# Patient Record
Sex: Female | Born: 1945 | Race: Black or African American | Hispanic: No | Marital: Single | Smoking: Former smoker
Health system: Southern US, Community
[De-identification: ages and names within clinical notes are randomized; demographics above are authoritative.]

## PROBLEM LIST (undated history)

## (undated) DIAGNOSIS — G510 Bell's palsy: Secondary | ICD-10-CM

## (undated) DIAGNOSIS — E785 Hyperlipidemia, unspecified: Secondary | ICD-10-CM

## (undated) DIAGNOSIS — I2510 Atherosclerotic heart disease of native coronary artery without angina pectoris: Secondary | ICD-10-CM

## (undated) DIAGNOSIS — R079 Chest pain, unspecified: Secondary | ICD-10-CM

## (undated) DIAGNOSIS — I219 Acute myocardial infarction, unspecified: Secondary | ICD-10-CM

## (undated) DIAGNOSIS — G43909 Migraine, unspecified, not intractable, without status migrainosus: Secondary | ICD-10-CM

## (undated) DIAGNOSIS — I10 Essential (primary) hypertension: Secondary | ICD-10-CM

## (undated) HISTORY — DX: Acute myocardial infarction, unspecified: I21.9

## (undated) HISTORY — DX: Atherosclerotic heart disease of native coronary artery without angina pectoris: I25.10

## (undated) HISTORY — DX: Chest pain, unspecified: R07.9

## (undated) HISTORY — DX: Migraine, unspecified, not intractable, without status migrainosus: G43.909

## (undated) HISTORY — DX: Bell's palsy: G51.0

## (undated) HISTORY — DX: Hyperlipidemia, unspecified: E78.5

## (undated) HISTORY — DX: Essential (primary) hypertension: I10

---
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
## 2008-04-07 ENCOUNTER — Emergency Department (HOSPITAL_COMMUNITY): Admission: EM | Admit: 2008-04-07 | Discharge: 2008-04-07 | Payer: Self-pay | Admitting: Family Medicine

## 2009-07-08 ENCOUNTER — Observation Stay (HOSPITAL_COMMUNITY): Admission: EM | Admit: 2009-07-08 | Discharge: 2009-07-09 | Payer: Self-pay | Admitting: Emergency Medicine

## 2009-07-08 ENCOUNTER — Ambulatory Visit: Payer: Self-pay | Admitting: Cardiovascular Disease

## 2009-07-09 ENCOUNTER — Encounter (INDEPENDENT_AMBULATORY_CARE_PROVIDER_SITE_OTHER): Payer: Self-pay | Admitting: Internal Medicine

## 2009-07-11 ENCOUNTER — Ambulatory Visit: Payer: Self-pay | Admitting: Family Medicine

## 2009-07-11 DIAGNOSIS — I2510 Atherosclerotic heart disease of native coronary artery without angina pectoris: Secondary | ICD-10-CM | POA: Insufficient documentation

## 2009-07-11 DIAGNOSIS — E785 Hyperlipidemia, unspecified: Secondary | ICD-10-CM | POA: Insufficient documentation

## 2009-07-11 DIAGNOSIS — G510 Bell's palsy: Secondary | ICD-10-CM | POA: Insufficient documentation

## 2009-07-11 DIAGNOSIS — I10 Essential (primary) hypertension: Secondary | ICD-10-CM

## 2009-07-11 HISTORY — DX: Hyperlipidemia, unspecified: E78.5

## 2009-07-11 HISTORY — DX: Essential (primary) hypertension: I10

## 2009-07-11 HISTORY — DX: Atherosclerotic heart disease of native coronary artery without angina pectoris: I25.10

## 2009-07-11 HISTORY — DX: Bell's palsy: G51.0

## 2010-06-23 ENCOUNTER — Ambulatory Visit: Payer: Self-pay | Admitting: Family Medicine

## 2010-06-23 LAB — CONVERTED CEMR LAB
Albumin: 4 g/dL (ref 3.5–5.2)
Alkaline Phosphatase: 66 units/L (ref 39–117)
Basophils Relative: 0.4 % (ref 0.0–3.0)
Bilirubin Urine: NEGATIVE
Blood in Urine, dipstick: NEGATIVE
CO2: 28 meq/L (ref 19–32)
Calcium: 9.5 mg/dL (ref 8.4–10.5)
Chloride: 106 meq/L (ref 96–112)
Cholesterol: 212 mg/dL — ABNORMAL HIGH (ref 0–200)
Eosinophils Absolute: 0.1 10*3/uL (ref 0.0–0.7)
Glucose, Urine, Semiquant: NEGATIVE
HDL: 45.8 mg/dL (ref >39.00)
Hemoglobin: 14.3 g/dL (ref 12.0–15.0)
Ketones, urine, test strip: NEGATIVE
Lymphocytes Relative: 39.7 % (ref 12.0–46.0)
MCHC: 34.5 g/dL (ref 30.0–36.0)
MCV: 92.7 fL (ref 78.0–100.0)
Neutro Abs: 3.4 10*3/uL (ref 1.4–7.7)
Potassium: 4.2 meq/L (ref 3.5–5.1)
RBC: 4.47 M/uL (ref 3.87–5.11)
Sodium: 141 meq/L (ref 135–145)
Specific Gravity, Urine: 1.02
Total CHOL/HDL Ratio: 5
Total Protein: 6.7 g/dL (ref 6.0–8.3)
Triglycerides: 119 mg/dL (ref 0.0–149.0)
VLDL: 23.8 mg/dL (ref 0.0–40.0)
pH: 7

## 2010-07-06 ENCOUNTER — Other Ambulatory Visit: Admission: RE | Admit: 2010-07-06 | Discharge: 2010-07-06 | Payer: Self-pay | Admitting: Family Medicine

## 2010-07-06 ENCOUNTER — Ambulatory Visit: Payer: Self-pay | Admitting: Family Medicine

## 2010-07-06 LAB — CONVERTED CEMR LAB: Cholesterol, target level: 200 mg/dL

## 2010-07-07 ENCOUNTER — Encounter: Payer: Self-pay | Admitting: Family Medicine

## 2010-07-09 ENCOUNTER — Encounter (INDEPENDENT_AMBULATORY_CARE_PROVIDER_SITE_OTHER): Payer: Self-pay | Admitting: *Deleted

## 2010-07-15 DIAGNOSIS — I219 Acute myocardial infarction, unspecified: Secondary | ICD-10-CM

## 2010-07-15 DIAGNOSIS — R079 Chest pain, unspecified: Secondary | ICD-10-CM | POA: Insufficient documentation

## 2010-07-15 DIAGNOSIS — G43909 Migraine, unspecified, not intractable, without status migrainosus: Secondary | ICD-10-CM

## 2010-07-15 HISTORY — DX: Chest pain, unspecified: R07.9

## 2010-07-15 HISTORY — DX: Migraine, unspecified, not intractable, without status migrainosus: G43.909

## 2010-07-15 HISTORY — DX: Acute myocardial infarction, unspecified: I21.9

## 2010-07-31 ENCOUNTER — Encounter (INDEPENDENT_AMBULATORY_CARE_PROVIDER_SITE_OTHER): Payer: Self-pay | Admitting: *Deleted

## 2010-08-10 ENCOUNTER — Encounter (INDEPENDENT_AMBULATORY_CARE_PROVIDER_SITE_OTHER): Payer: Self-pay | Admitting: *Deleted

## 2010-08-12 ENCOUNTER — Ambulatory Visit: Payer: Self-pay | Admitting: Gastroenterology

## 2010-08-24 ENCOUNTER — Ambulatory Visit: Payer: Self-pay | Admitting: Family Medicine

## 2010-08-26 ENCOUNTER — Ambulatory Visit: Payer: Self-pay | Admitting: Gastroenterology

## 2010-08-26 LAB — CONVERTED CEMR LAB
Albumin: 3.8 g/dL (ref 3.5–5.2)
Bilirubin, Direct: 0.1 mg/dL (ref 0.0–0.3)
Cholesterol: 145 mg/dL (ref 0–200)
HDL: 44.9 mg/dL (ref >39.00)
LDL Cholesterol: 85 mg/dL (ref 0–99)
Total Protein: 6.4 g/dL (ref 6.0–8.3)
Triglycerides: 74 mg/dL (ref 0.0–149.0)
VLDL: 14.8 mg/dL (ref 0.0–40.0)

## 2010-08-30 LAB — HM COLONOSCOPY

## 2010-09-02 ENCOUNTER — Ambulatory Visit: Payer: Self-pay | Admitting: Family Medicine

## 2010-10-06 NOTE — Letter (Signed)
Summary: Appointment - Missed  [facility], Main Office  [address]   [place], [place] [address]   Phone: [phone]  Fax: [phone]        July 31, 2010 MRN: [id number]      Amy Perry [address] [place], [place]  [address]     Dear Ms. Beverely Pace,  Our records indicate you missed your appointment on July 16, 2010 with Dr. Eden Emms. It is very important that we reach you to reschedule this appointment. We look forward to participating in your health care needs. Please contact us at the number listed above at your earliest convenience to reschedule this appointment.     Sincerely,   Glass blower/designer

## 2010-10-06 NOTE — Assessment & Plan Note (Signed)
Summary: CPX/PAP/CJR   Vital Signs:  Patient profile:   65 year old female Menstrual status:  postmenopausal Height:      62.25 inches Weight:      169 pounds BMI:     30.77 Temp:     98.0 degrees F oral Pulse rate:   80 / minute Pulse rhythm:   regular Resp:     12 per minute BP sitting:   140 / 98  (left arm) Cuff size:   regular  Vitals Entered By: Sid Falcon LPN (July 06, 2010 3:07 PM)  Nutrition Counseling: Patient's BMI is greater than 25 and therefore counseled on weight management options.  History of Present Illness: Patient here for complete physical examination.  Patient has history of coronary artery disease with reported angioplasty back in 1997. She is currently not on antilipid therapy.  Reported hx myalgias with Lipitor.  She has not seen a cardiologist in several years.  No recent chest pains.  Hypertension treated with Prinivil 10 mg daily and recently been out of medication. Also takes aspirin 325 mg daily.  Last Pap smear one year ago. No history of abnormality. Needs followup mammogram. No prior history of colonoscopy screening. Last tetanus unknown. Needs flu vaccine.  Hypertension History:      She denies headache, chest pain, palpitations, dyspnea with exertion, orthopnea, PND, peripheral edema, visual symptoms, neurologic problems, syncope, and side effects from treatment.  She notes no problems with any antihypertensive medication side effects.        Positive major cardiovascular risk factors include female age 62 years old or older, hyperlipidemia, and hypertension.  Negative major cardiovascular risk factors include no history of diabetes and non-tobacco-user status.        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  Further assessment for target organ damage reveals no history of stroke/TIA or peripheral vascular disease.    Lipid Management History:      Positive NCEP/ATP III risk factors include  female age 37 years old or older, hypertension, and ASHD (either angina/prior MI/prior CABG).  Negative NCEP/ATP III risk factors include non-diabetic, non-tobacco-user status, no prior stroke/TIA, no peripheral vascular disease, and no history of aortic aneurysm.    Clinical Review Panels:  Immunizations   Last Tetanus Booster:  Tdap (07/06/2010)   Last Flu Vaccine:  Fluvax 3+ (07/06/2010)  Lipid Management   Cholesterol:  212 (06/23/2010)   HDL (good cholesterol):  45.80 (06/23/2010)  Diabetes Management   Creatinine:  0.8 (06/23/2010)   Last Flu Vaccine:  Fluvax 3+ (07/06/2010)  CBC   WBC:  6.9 (06/23/2010)   RBC:  4.47 (06/23/2010)   Hgb:  14.3 (06/23/2010)   Hct:  41.4 (06/23/2010)   Platelets:  275.0 (06/23/2010)   MCV  92.7 (06/23/2010)   MCHC  34.5 (06/23/2010)   RDW  13.7 (06/23/2010)   PMN:  49.3 (06/23/2010)   Lymphs:  39.7 (06/23/2010)   Monos:  8.7 (06/23/2010)   Eosinophils:  1.9 (06/23/2010)   Basophil:  0.4 (06/23/2010)  Complete Metabolic Panel   Glucose:  99 (06/23/2010)   Sodium:  141 (06/23/2010)   Potassium:  4.2 (06/23/2010)   Chloride:  106 (06/23/2010)   CO2:  28 (06/23/2010)   BUN:  17 (06/23/2010)   Creatinine:  0.8 (06/23/2010)   Albumin:  4.0 (06/23/2010)   Total Protein:  6.7 (06/23/2010)   Calcium:  9.5 (06/23/2010)   Total Bili:  0.9 (06/23/2010)   Alk Phos:  66 (06/23/2010)  SGPT (ALT):  28 (06/23/2010)   SGOT (AST):  34 (06/23/2010)   Allergies: 1)  ! Sulfamethoxazole-Trimethoprim (Sulfamethoxazole-Trimethoprim)  Past History:  Past Medical History: Last updated: 07/11/2009 Hyperlipidemia Hypertension CAD  angioplasty 1997  Past Surgical History: Last updated: 07/11/2009 Appendectomy  1954  Family History: Last updated: 07/11/2009 Hyperlipidemia  parents Epilepsy  mother CAD  parent  Social History: Last updated: 07/11/2009 Occupation:  nanny and Airline pilot associate Former Smoker Alcohol use-no  Risk  Factors: Smoking Status: quit (07/11/2009) PMH-FH-SH reviewed for relevance  Review of Systems  The patient denies anorexia, fever, weight loss, weight gain, vision loss, decreased hearing, hoarseness, chest pain, syncope, dyspnea on exertion, peripheral edema, prolonged cough, headaches, hemoptysis, abdominal pain, melena, hematochezia, severe indigestion/heartburn, hematuria, incontinence, genital sores, muscle weakness, suspicious skin lesions, transient blindness, difficulty walking, depression, unusual weight change, abnormal bleeding, enlarged lymph nodes, and breast masses.    Physical Exam  General:  Well-developed,well-nourished,in no acute distress; alert,appropriate and cooperative throughout examination Head:  Normocephalic and atraumatic without obvious abnormalities. No apparent alopecia or balding. Eyes:  pupils equal, pupils round, and pupils reactive to light.   Ears:  External ear exam shows no significant lesions or deformities.  Otoscopic examination reveals clear canals, tympanic membranes are intact bilaterally without bulging, retraction, inflammation or discharge. Hearing is grossly normal bilaterally. Mouth:  Oral mucosa and oropharynx without lesions or exudates.  Teeth in good repair. Neck:  No deformities, masses, or tenderness noted. Breasts:  No mass, nodules, thickening, tenderness, bulging, retraction, inflamation, nipple discharge or skin changes noted.   Lungs:  Normal respiratory effort, chest expands symmetrically. Lungs are clear to auscultation, no crackles or wheezes. Heart:  Normal rate and regular rhythm. S1 and S2 normal without gallop, murmur, click, rub or other extra sounds. Abdomen:  Bowel sounds positive,abdomen soft and non-tender without masses, organomegaly or hernias noted. Genitalia:  Normal introitus for age, no external lesions, no vaginal discharge, mucosa pink and moist, no vaginal or cervical lesions, no vaginal atrophy, no friaility or  hemorrhage, normal uterus size and position, no adnexal masses or tenderness Extremities:  No clubbing, cyanosis, edema, or deformity noted with normal full range of motion of all joints.   Neurologic:  alert & oriented X3, cranial nerves II-XII intact, and strength normal in all extremities.   Skin:  no rashes and no suspicious lesions.   Cervical Nodes:  No lymphadenopathy noted Psych:  normally interactive, good eye contact, not anxious appearing, and not depressed appearing.     Impression & Recommendations:  Problem # 1:  Preventive Health Care (ICD-V70.0) labs reviewed with pt.  Flu vaccine and Tdap given.  Pap obtained.  She will set up mammogram. Needs colonoscopy screening.  Problem # 2:  HYPERTENSION (ICD-401.9) Assessment: Deteriorated refilled med. Her updated medication list for this problem includes:    Prinivil 10 Mg Tabs (Lisinopril) ..... One by mouth daily  Problem # 3:  HYPERLIPIDEMIA (ICD-272.4) Assessment: Deteriorated Lipids not to goal.  Start Crestor 5 mg daily with samples given and repeat lipids and hepatic in 2 months.  Problem # 4:  CAD (ICD-414.00) needs to establish with local cardiologist and will set up. Her updated medication list for this problem includes:    Prinivil 10 Mg Tabs (Lisinopril) ..... One by mouth daily    Aspirin 325 Mg Tabs (Aspirin) ..... One by mouth daily  Orders: Cardiology Referral (Cardiology)  Complete Medication List: 1)  Prinivil 10 Mg Tabs (Lisinopril) .... One by mouth daily 2)  Aspirin 325 Mg Tabs (Aspirin) .... One by mouth daily 3)  Fish Oil Concentrate 300 Mg Caps (Omega-3 fatty acids) 4)  Qc Womens Daily Multivitamin Tabs (Multiple vitamins-minerals)  Other Orders: Admin 1st Vaccine ([address]) Flu Vaccine 70yrs + (82956) Tdap => 58yrs IM (21308) Admin of Any Addtl Vaccine (65784) Pap Smear, Thin Prep ( Collection of) (O9629) Gastroenterology Referral (GI)  Hypertension Assessment/Plan:      The patient's  hypertensive risk group is category C: Target organ damage and/or diabetes.  Today's blood pressure is 140/98.    Lipid Assessment/Plan:      Based on NCEP/ATP III, the patient's risk factor category is "history of coronary disease, peripheral vascular disease, cerebrovascular disease, or aortic aneurysm".  The patient's lipid goals are as follows: Total cholesterol goal is 200; LDL cholesterol goal is 100; HDL cholesterol goal is 40; Triglyceride goal is 150.      Patient Instructions: 1)  Start Crestor 10 mg one half tablet daily. 2)  Please schedule a follow-up appointment in 2 months.  3)  Hepatic Panel prior to visit ICD-9: 272.4 4)  Lipid panel prior to visit ICD-9 : 272.4 5)  Check your  Blood Pressure regularly . If it is above: 140/90  you should make an appointment. Prescriptions: PRINIVIL 10 MG TABS (LISINOPRIL) one by mouth daily  #90 x 3   Entered and Authorized by:   Evelena Peat MD   Signed by:   Evelena Peat MD on 07/06/2010   Method used:   Electronically to        [facility]  [phone]* (retail)       [address]       [place], [place]  [address]       Ph: [phone] or [phone]       Fax: [phone]   RxID:   [id number]    Orders Added: 1)  Admin 1st Vaccine [90471] 2)  Flu Vaccine 87yrs + [01093] 3)  Tdap => 92yrs IM [23557] 4)  Admin of Any Addtl Vaccine [90472] 5)  Est. Patient 40-64 years [99396] 6)  Pap Smear, Thin Prep ( Collection of) [Q0091] 7)  Gastroenterology Referral [GI] 8)  Cardiology Referral [Cardiology] 9)  Est. Patient Level IV [32202]   Immunizations Administered:  Tetanus Vaccine:    Vaccine Type: Tdap    Site: right deltoid    Mfr: GlaxoSmithKline    Dose: 0.5 ml    Route: IM    Given by: Sid Falcon LPN    Exp. Date: 06/25/2012    Lot #: [id number]    VIS given: 07/24/08 version given July 06, 2010.   Immunizations Administered:  Tetanus Vaccine:    Vaccine Type:  Tdap    Site: right deltoid    Mfr: GlaxoSmithKline    Dose: 0.5 ml    Route: IM    Given by: Sid Falcon LPN    Exp. Date: 06/25/2012    Lot #: [id number]    VIS given: 07/24/08 version given July 06, 2010. Flu Vaccine Consent Questions     Do you have a history of severe allergic reactions to this vaccine? no    Any prior history of allergic reactions to egg and/or gelatin? no    Do you have a sensitivity to the preservative Thimersol? no    Do you have a past history of Guillan-Barre Syndrome? no    Do you currently have an acute febrile illness? no  Have you ever had a severe reaction to latex? no    Vaccine information given and explained to patient? yes    Are you currently pregnant? no    Lot Number:[id number]   Exp Date:03/06/2011   Site Given  Left Deltoid IM .lbflu1

## 2010-10-06 NOTE — Letter (Signed)
Summary: Pre Visit Letter Revised  [facility]  [address] [place], [place] [address]   Phone: [phone]  Fax: [phone]        07/09/2010 MRN: [id number] Amy Perry [address] [place], [place]  [address]             Procedure Date:  08-26-10   Welcome to the Gastroenterology Division at [facility].    You are scheduled to see a nurse for your pre-procedure visit on 08-12-10 at 4:30p.m. on the 3rd floor at [facility], [address]  We ask that you try to arrive at our office 15 minutes prior to your appointment time to allow for check-in.  Please take a minute to review the attached form.  If you answer "Yes" to one or more of the questions on the first page, we ask that you call the person listed at your earliest opportunity.  If you answer "No" to all of the questions, please complete the rest of the form and bring it to your appointment.    Your nurse visit will consist of discussing your medical and surgical history, your immediate family medical history, and your medications.   If you are unable to list all of your medications on the form, please bring the medication bottles to your appointment and we will list them.  We will need to be aware of both prescribed and over the counter drugs.  We will need to know exact dosage information as well.    Please be prepared to read and sign documents such as consent forms, a financial agreement, and acknowledgement forms.  If necessary, and with your consent, a friend or relative is welcome to sit-in on the nurse visit with you.  Please bring your insurance card so that we may make a copy of it.  If your insurance requires a referral to see a specialist, please bring your referral form from your primary care physician.  No co-pay is required for this nurse visit.     If you cannot keep your appointment, please call [phone] to cancel or reschedule prior to your appointment date.  This allows [place]  the opportunity to schedule an appointment for another patient in need of care.    Thank you for choosing [facility] for your medical needs.  We appreciate the opportunity to care for you.  Please visit [place] at our website  to learn more about our practice.  Sincerely, The Gastroenterology Division

## 2010-10-06 NOTE — Miscellaneous (Signed)
Summary: [facility]  Clinical Lists Changes  Medications: Added new medication of MOVIPREP 100 GM  SOLR (PEG-KCL-NACL-NASULF-NA ASC-C) As per prep instructions. - Signed Rx of MOVIPREP 100 GM  SOLR (PEG-KCL-NACL-NASULF-NA ASC-C) As per prep instructions.;  #1 x 0;  Signed;  Entered by: Ezra Sites RN;  Authorized by: Mardella Layman MD [facility];  Method used: Electronically to [facility]  [phone]*, [address], [place], [place], [place]  [address], Ph: [phone] or [phone], Fax: [phone] Allergies: Changed allergy or adverse reaction from SULFAMETHOXAZOLE-TRIMETHOPRIM (SULFAMETHOXAZOLE-TRIMETHOPRIM) to SULFAMETHOXAZOLE-TRIMETHOPRIM (SULFAMETHOXAZOLE-TRIMETHOPRIM) Added new allergy or adverse reaction of PCN    Prescriptions: MOVIPREP 100 GM  SOLR (PEG-KCL-NACL-NASULF-NA ASC-C) As per prep instructions.  #1 x 0   Entered by:   Ezra Sites RN   Authorized by:   Mardella Layman MD [facility]   Signed by:   Ezra Sites RN on 08/12/2010   Method used:   Electronically to        [facility]  [phone]* (retail)       [address]       [place], [place]  [address]       Ph: [phone] or [phone]       Fax: [phone]   RxID:   [id number]

## 2010-10-06 NOTE — Miscellaneous (Signed)
Summary: Orders Update   Clinical Lists Changes  Orders: Added new Referral order of Gastroenterology Referral (GI) - Signed 

## 2010-10-06 NOTE — Letter (Signed)
Summary: [facility] Instructions  [facility]  [address] [place], [place] [address]   Phone: [phone]  Fax: [phone]       Amy Perry    10/10/1945    MRN: [id number]        Procedure Day Dorna Bloom: Wednesday 08-26-10     Arrival Time: 8:30 a.m.     Procedure Time: 9:30 a.m.     Location of Procedure:                    _x _   [facility] (4th Floor)   PREPARATION FOR COLONOSCOPY WITH MOVIPREP   Starting 5 days prior to your procedure  08-21-10 do not eat nuts, seeds, popcorn, corn, beans, peas,  salads, or any raw vegetables.  Do not take any fiber supplements (e.g. Metamucil, Citrucel, and Benefiber).  THE DAY BEFORE YOUR PROCEDURE         DATE:  08-25-10  DAY: Tuesday   1.  Drink clear liquids the entire day-NO SOLID FOOD  2.  Do not drink anything colored red or purple.  Avoid juices with pulp.  No orange juice.  3.  Drink at least 64 oz. (8 glasses) of fluid/clear liquids during the day to prevent dehydration and help the prep work efficiently.  CLEAR LIQUIDS INCLUDE: Water Jello Ice Popsicles Tea (sugar ok, no milk/cream) Powdered fruit flavored drinks Coffee (sugar ok, no milk/cream) Gatorade Juice: apple, white grape, white cranberry  Lemonade Clear bullion, consomm, broth Carbonated beverages (any kind) Strained chicken noodle soup Hard Candy                             4.  In the morning, mix first dose of MoviPrep solution:    Empty 1 Pouch A and 1 Pouch B into the disposable container    Add lukewarm drinking water to the top line of the container. Mix to dissolve    Refrigerate (mixed solution should be used within 24 hrs)  5.  Begin drinking the prep at 5:00 p.m. The MoviPrep container is divided by 4 marks.   Every 15 minutes drink the solution down to the next mark (approximately 8 oz) until the full liter is complete.   6.  Follow completed prep with 16 oz of clear liquid of your choice (Nothing red or  purple).  Continue to drink clear liquids until bedtime.  7.  Before going to bed, mix second dose of MoviPrep solution:    Empty 1 Pouch A and 1 Pouch B into the disposable container    Add lukewarm drinking water to the top line of the container. Mix to dissolve    Refrigerate  THE DAY OF YOUR PROCEDURE      DATE:  08-26-10   DAY:  Wednesday  Beginning at  4:30 a.m. (5 hours before procedure):         1. Every 15 minutes, drink the solution down to the next mark (approx 8 oz) until the full liter is complete.  2. Follow completed prep with 16 oz. of clear liquid of your choice.    3. You may drink clear liquids until  7:30 a.m. (2 HOURS BEFORE PROCEDURE).   MEDICATION INSTRUCTIONS  Unless otherwise instructed, you should take regular prescription medications with a small sip of water   as early as possible the morning of your procedure.  OTHER INSTRUCTIONS  You will need a responsible adult at least 64 years of age to accompany you and drive you home.   This person must remain in the waiting room during your procedure.  Wear loose fitting clothing that is easily removed.  Leave jewelry and other valuables at home.  However, you may wish to bring a book to read or  an iPod/MP3 player to listen to music as you wait for your procedure to start.  Remove all body piercing jewelry and leave at home.  Total time from sign-in until discharge is approximately 2-3 hours.  You should go home directly after your procedure and rest.  You can resume normal activities the  day after your procedure.  The day of your procedure you should not:   Drive   Make legal decisions   Operate machinery   Drink alcohol   Return to work  You will receive specific instructions about eating, activities and medications before you leave.    The above instructions have been reviewed and explained to me by   Ezra Sites RN  August 12, 2010 5:02 PM    I fully understand  and can verbalize these instructions _____________________________ Date _________

## 2010-10-08 NOTE — Assessment & Plan Note (Signed)
Summary: 2 month fup//ccm rsc bmp/njr   Vital Signs:  Patient profile:   65 year old female Menstrual status:  postmenopausal Weight:      166 pounds Temp:     97.9 degrees F oral BP sitting:   110 / 64  (left arm) Cuff size:   regular  Vitals Entered By: Sid Falcon LPN (September 02, 2010 8:44 AM)  History of Present Illness: Here for follow up hyperlipidemia and hypertension. BP well controlled.  Pt had not been on antilipid therapy and we added  Crestor last visit.  Toleratin fairly well.  ?of myalgias previously on Lipitor. Lipids greatly improved on Crestor 5 mg daily.  She has not yet established with cardiologist but we have referred. No recent chest pains or dizziness.  Hypertension History:      She denies headache, chest pain, palpitations, dyspnea with exertion, orthopnea, peripheral edema, visual symptoms, neurologic problems, syncope, and side effects from treatment.        Positive major cardiovascular risk factors include female age 64 years old or older, hyperlipidemia, and hypertension.  Negative major cardiovascular risk factors include no history of diabetes and non-tobacco-user status.        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  Further assessment for target organ damage reveals no history of stroke/TIA or peripheral vascular disease.     Allergies: 1)  ! Sulfamethoxazole-Trimethoprim (Sulfamethoxazole-Trimethoprim) 2)  ! Pcn  Past History:  Past Medical History: Last updated: 07/15/2010 CAD  angioplasty 1997 HYPERTENSION  HYPERLIPIDEMIA MI  MIGRAINE HEADACHE  CHEST PAIN ROUTINE GENERAL MEDICAL EXAM@HEALTH  CARE FACL  BELLS PALSY  PMH reviewed for relevance  Physical Exam  General:  Well-developed,well-nourished,in no acute distress; alert,appropriate and cooperative throughout examination Mouth:  Oral mucosa and oropharynx without lesions or exudates.  Teeth in good repair. Neck:  No  deformities, masses, or tenderness noted. Lungs:  Normal respiratory effort, chest expands symmetrically. Lungs are clear to auscultation, no crackles or wheezes. Heart:  normal rate and regular rhythm.   Extremities:  No clubbing, cyanosis, edema, or deformity noted with normal full range of motion of all joints.     Impression & Recommendations:  Problem # 1:  HYPERTENSION (ICD-401.9)  Her updated medication list for this problem includes:    Prinivil 10 Mg Tabs (Lisinopril) ..... One by mouth daily  Problem # 2:  HYPERLIPIDEMIA (ICD-272.4)  Her updated medication list for this problem includes:    Crestor 10 Mg Tabs (Rosuvastatin calcium) ..... One half daily  Complete Medication List: 1)  Prinivil 10 Mg Tabs (Lisinopril) .... One by mouth daily 2)  Aspirin 325 Mg Tabs (Aspirin) .... One by mouth daily 3)  Fish Oil Concentrate 300 Mg Caps (Omega-3 fatty acids) 4)  Qc Womens Daily Multivitamin Tabs (Multiple vitamins-minerals) 5)  Crestor 10 Mg Tabs (Rosuvastatin calcium) .... One half daily  Hypertension Assessment/Plan:      The patient's hypertensive risk group is category C: Target organ damage and/or diabetes.  Today's blood pressure is 110/64.    Patient Instructions: 1)  Please schedule a follow-up appointment in 6 months .    Orders Added: 1)  Est. Patient Level III [[address]]

## 2010-10-08 NOTE — Procedures (Signed)
Summary: Colonoscopy  Patient: [name] Note: All result statuses are Final unless otherwise noted.  Tests: (1) Colonoscopy (COL)   COL Colonoscopy           DONE     [facility]     [address]     [place], [place]  [address]           COLONOSCOPY PROCEDURE REPORT           PATIENT:  Amy Perry, Amy Perry  MR#:  [id number]     BIRTHDATE:  [birth year], 64 yrs. old  GENDER:  female     ENDOSCOPIST:  Vania Rea. Jarold Motto, MD, [facility]     REF. BY:  Evelena Peat, M.D.     PROCEDURE DATE:  08/26/2010     PROCEDURE:  Average-risk screening colonoscopy     G0121     ASA CLASS:  Class II     INDICATIONS:  Routine Risk Screening     MEDICATIONS:   Fentanyl 75 mcg IV, Versed 8 mg IV           DESCRIPTION OF PROCEDURE:   After the risks benefits and     alternatives of the procedure were thoroughly explained, informed     consent was obtained.  Digital rectal exam was performed and     revealed no abnormalities.   The [id number] endoscope was     introduced through the anus and advanced to the cecum, which was     identified by both the appendix and ileocecal valve, limited by a     redundant colon, extreme patient discomfort.    The quality of the     prep was excellent, using MoviPrep.  The instrument was then     slowly withdrawn as the colon was fully examined.     <<PROCEDUREIMAGES>>           FINDINGS:  No polyps or cancers were seen.  This was otherwise a     normal examination of the colon.   Retroflexed views in the rectum     revealed no abnormalities.    The scope was then withdrawn from     the patient and the procedure completed.           COMPLICATIONS:  None     ENDOSCOPIC IMPRESSION:     1) No polyps or cancers     2) Otherwise normal examination     RECOMMENDATIONS:     1) Repeat Colonscopy in 10 years.     REPEAT EXAM:  No           ______________________________     Vania Rea. Jarold Motto, MD, [facility]           CC:           n.     eSIGNED:   Vania Rea.  Patterson at 08/26/2010 10:25 AM           Chauncey Cruel, [id number]  Note: An exclamation mark (!) indicates a result that was not dispersed into the flowsheet. Document Creation Date: 08/26/2010 10:25 AM _______________________________________________________________________  (1) Order result status: Final Collection or observation date-time: 08/26/2010 10:17 Requested date-time:  Receipt date-time:  Reported date-time:  Referring Physician:   Ordering Physician: Sheryn Bison [phone]) Specimen Source:  Source: Launa Grill Order Number: [id number] Lab site:   Appended Document: Colonoscopy    Clinical Lists Changes  Observations: Added new observation of COLONNXTDUE: 08/2020 (08/26/2010 10:42)

## 2010-11-15 IMAGING — CT CT HEAD W/O CM
1 series · 16 of 30 positions shown, 20 images · non-contrast
Comparison: None

CLINICAL DATA: Facial asymmetry. Right arm pain.  Evaluate for
stroke.

CT HEAD WITHOUT CONTRAST
TECHNIQUE: Contiguous axial images were obtained from the base of
the skull through the vertex without contrast.

[Series 2: brain · axial · 0.47mm/px · z∈[+142,+278]mm · 16 of 30 slices shown, 20 images]
[im 2/30  brain]
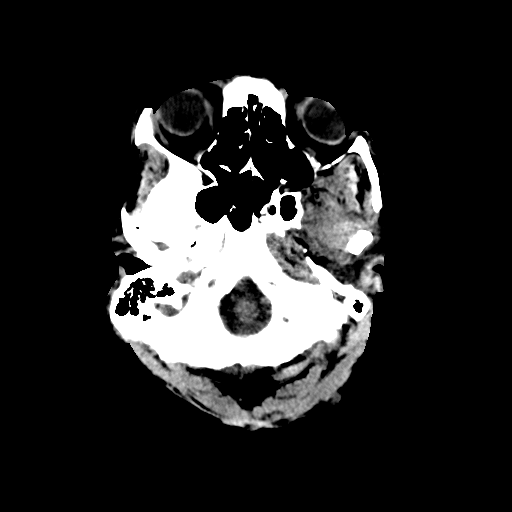
[im 2/30  bone]
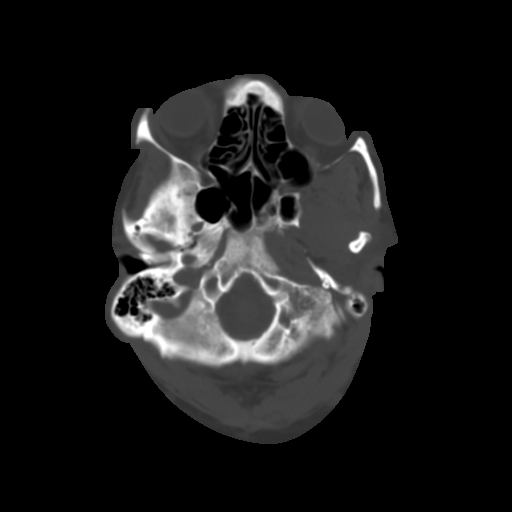
[im 4/30  brain]
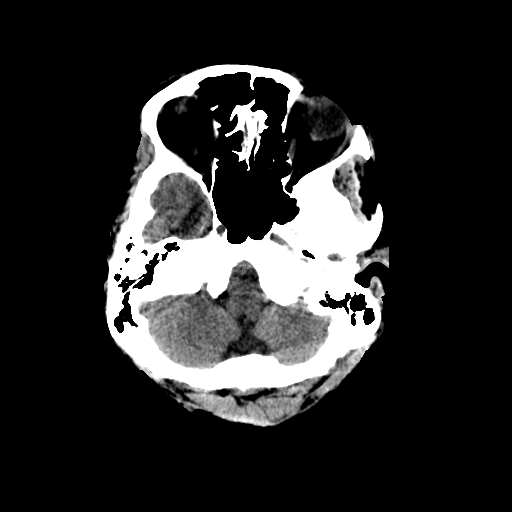
[im 6/30  brain]
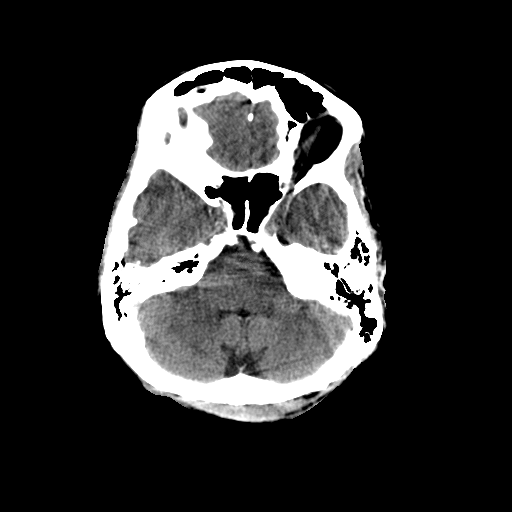
[im 8/30  brain]
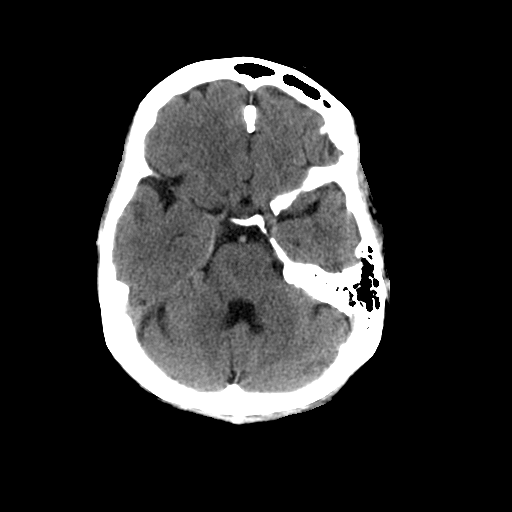
[im 9/30  brain]
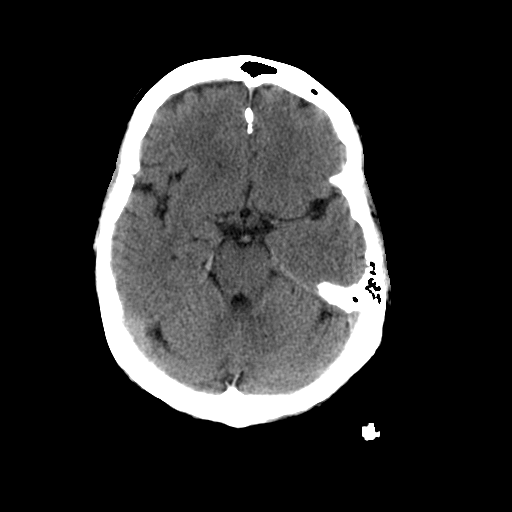
[im 9/30  bone]
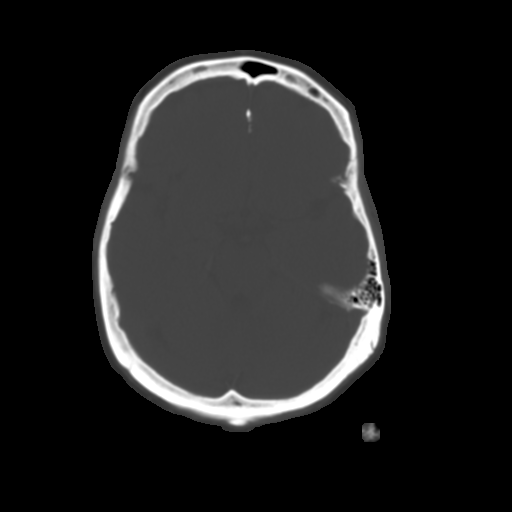
[im 11/30  brain]
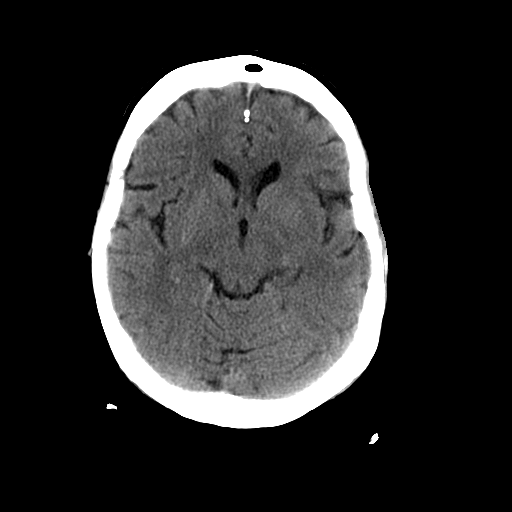
[im 13/30  brain]
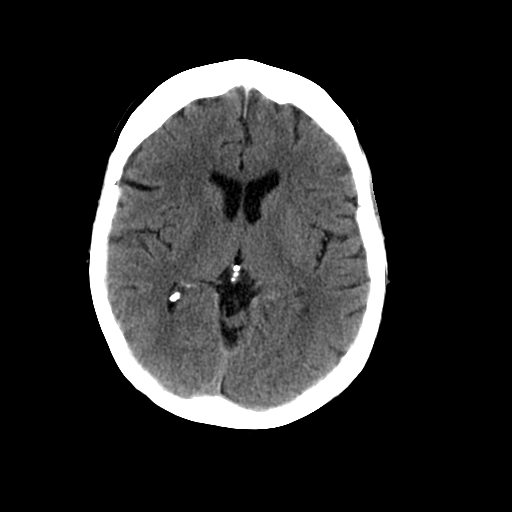
[im 15/30  brain]
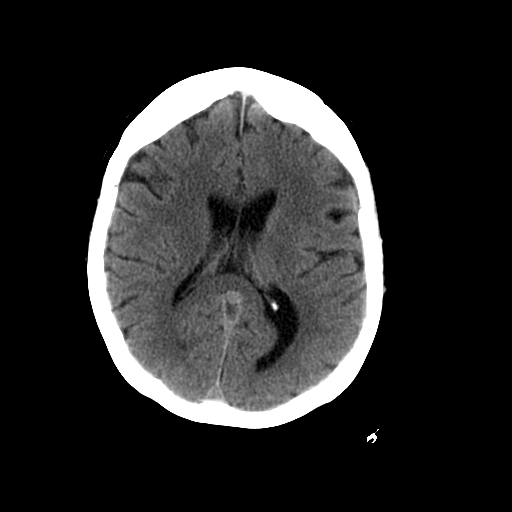
[im 16/30  brain]
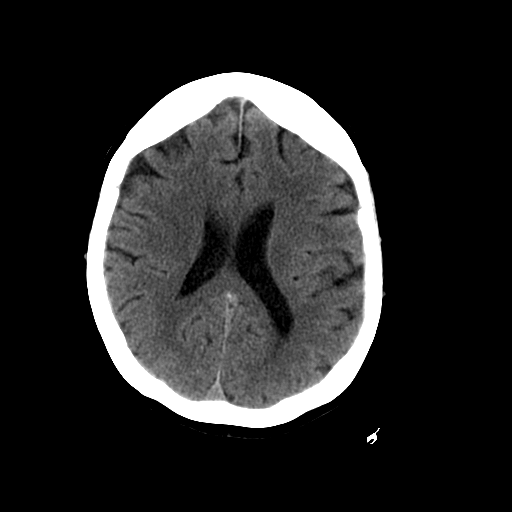
[im 16/30  bone]
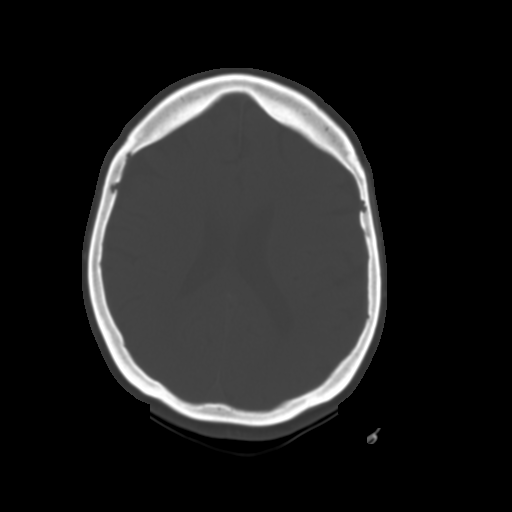
[im 18/30  brain]
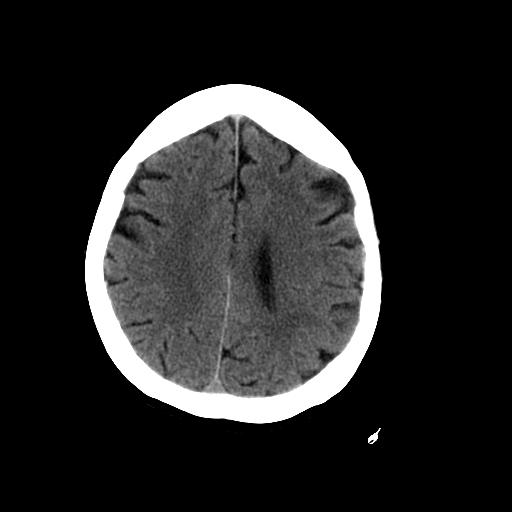
[im 20/30  brain]
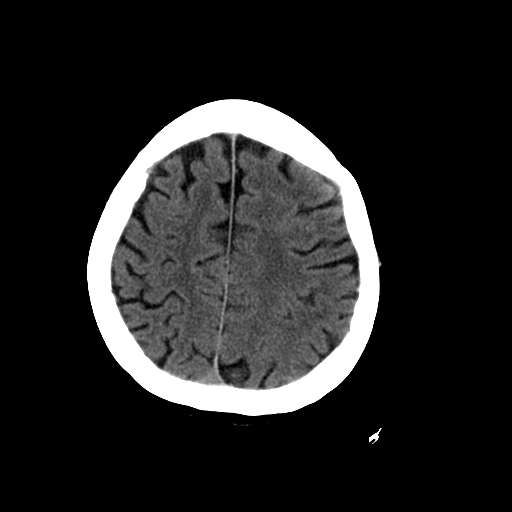
[im 22/30  brain]
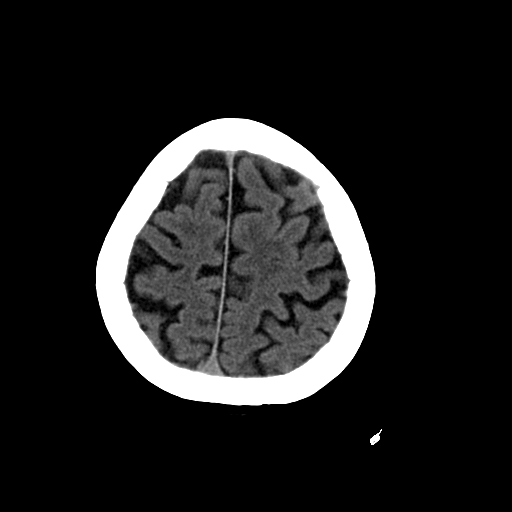
[im 23/30  brain]
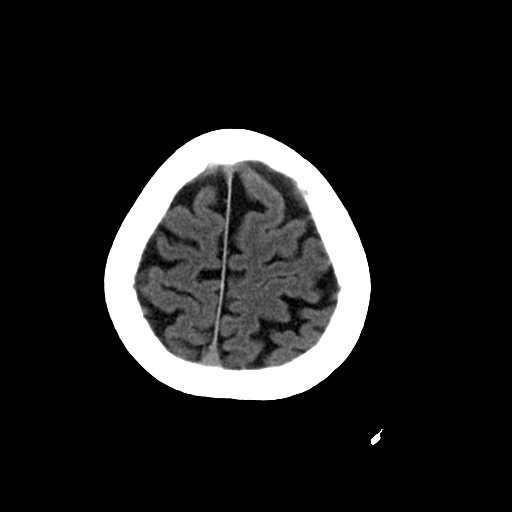
[im 23/30  bone]
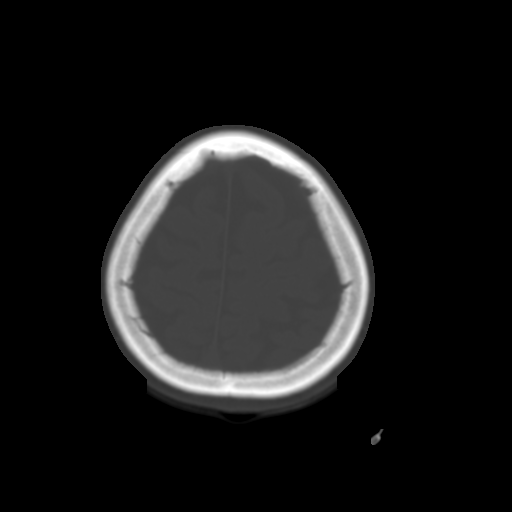
[im 25/30  brain]
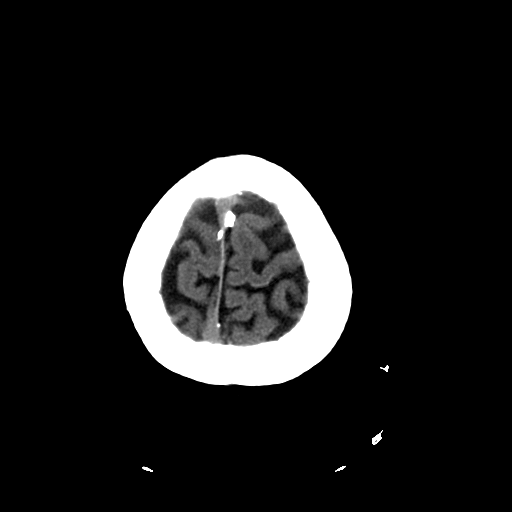
[im 27/30  brain]
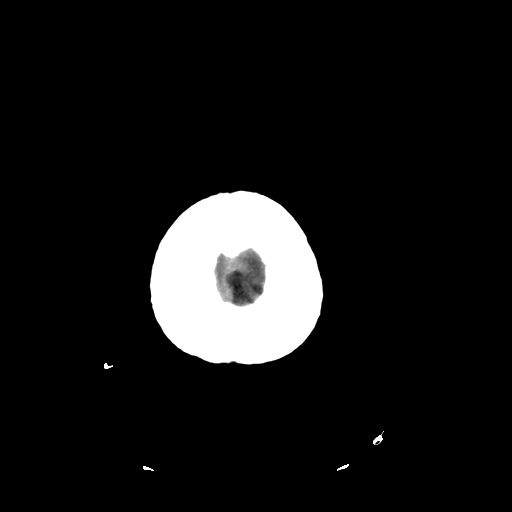
[im 29/30  brain]
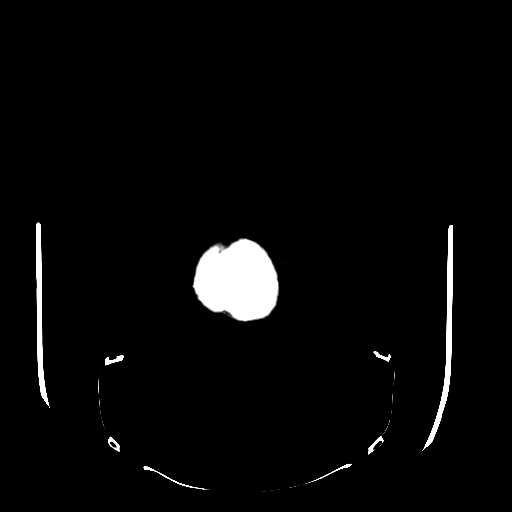

[16 of 30 positions shown; findings below may reference images not displayed]

FINDINGS: No acute intracranial findings.  Negative for hemorrhage,
mass lesion, acute infarction, or hydrocephalus.
IMPRESSION: No acute intracranial findings.

## 2010-12-09 LAB — CBC
HCT: 38.3 % (ref 36.0–46.0)
Hemoglobin: 14.6 g/dL (ref 12.0–15.0)
MCHC: 34.8 g/dL (ref 30.0–36.0)
MCV: 92.6 fL (ref 78.0–100.0)
Platelets: 247 10*3/uL (ref 150–400)
RBC: 4.51 MIL/uL (ref 3.87–5.11)
RDW: 13.1 % (ref 11.5–15.5)
WBC: 5.3 10*3/uL (ref 4.0–10.5)
WBC: 6.8 10*3/uL (ref 4.0–10.5)

## 2010-12-09 LAB — LIPID PANEL
Cholesterol: 146 mg/dL (ref 0–200)
HDL: 29 mg/dL — ABNORMAL LOW (ref >39)
LDL Cholesterol: 97 mg/dL (ref 0–99)
Triglycerides: 101 mg/dL (ref <150)

## 2010-12-09 LAB — CARDIAC PANEL(CRET KIN+CKTOT+MB+TROPI)
CK, MB: 1.7 ng/mL (ref 0.3–4.0)
Relative Index: INVALID (ref 0.0–2.5)
Total CK: 93 U/L (ref 7–177)
Troponin I: 0.01 ng/mL (ref 0.00–0.06)
Troponin I: 0.02 ng/mL (ref 0.00–0.06)

## 2010-12-09 LAB — COMPREHENSIVE METABOLIC PANEL
ALT: 40 U/L — ABNORMAL HIGH (ref 0–35)
Alkaline Phosphatase: 72 U/L (ref 39–117)
CO2: 22 mEq/L (ref 19–32)
Chloride: 106 mEq/L (ref 96–112)
GFR calc non Af Amer: >60 mL/min (ref >60)
Glucose, Bld: 110 mg/dL — ABNORMAL HIGH (ref 70–99)
Potassium: 3.8 mEq/L (ref 3.5–5.1)
Sodium: 136 mEq/L (ref 135–145)

## 2010-12-09 LAB — POCT CARDIAC MARKERS
CKMB, poc: 3.8 ng/mL (ref 1.0–8.0)
Myoglobin, poc: 80.5 ng/mL (ref 12–200)
Troponin i, poc: <0.05 ng/mL (ref 0.00–0.09)
Troponin i, poc: <0.05 ng/mL (ref 0.00–0.09)

## 2010-12-09 LAB — DIFFERENTIAL
Basophils Relative: 0 % (ref 0–1)
Eosinophils Absolute: 0.1 10*3/uL (ref 0.0–0.7)
Eosinophils Relative: 1 % (ref 0–5)
Monocytes Relative: 12 % (ref 3–12)
Neutrophils Relative %: 58 % (ref 43–77)

## 2010-12-09 LAB — BASIC METABOLIC PANEL
BUN: 17 mg/dL (ref 6–23)
Calcium: 8.5 mg/dL (ref 8.4–10.5)
Chloride: 111 mEq/L (ref 96–112)
Creatinine, Ser: 0.69 mg/dL (ref 0.4–1.2)
GFR calc Af Amer: >60 mL/min (ref >60)
GFR calc non Af Amer: >60 mL/min (ref >60)

## 2010-12-09 LAB — D-DIMER, QUANTITATIVE: D-Dimer, Quant: 0.87 ug/mL-FEU — ABNORMAL HIGH (ref 0.00–0.48)

## 2010-12-09 LAB — HEPATIC FUNCTION PANEL
Bilirubin, Direct: 0.1 mg/dL (ref 0.0–0.3)
Indirect Bilirubin: 0.2 mg/dL — ABNORMAL LOW (ref 0.3–0.9)
Total Protein: 6 g/dL (ref 6.0–8.3)

## 2010-12-09 LAB — CK TOTAL AND CKMB (NOT AT ARMC): CK, MB: 2.3 ng/mL (ref 0.3–4.0)

## 2010-12-09 LAB — LIPASE, BLOOD: Lipase: 26 U/L (ref 11–59)

## 2011-03-01 ENCOUNTER — Encounter: Payer: Self-pay | Admitting: Family Medicine

## 2011-03-03 ENCOUNTER — Ambulatory Visit: Payer: Self-pay | Admitting: Family Medicine

## 2011-04-08 ENCOUNTER — Encounter: Payer: Self-pay | Admitting: Family Medicine

## 2011-04-15 ENCOUNTER — Ambulatory Visit (INDEPENDENT_AMBULATORY_CARE_PROVIDER_SITE_OTHER): Payer: Managed Care, Other (non HMO) | Admitting: Family Medicine

## 2011-04-15 ENCOUNTER — Encounter: Payer: Self-pay | Admitting: Family Medicine

## 2011-04-15 DIAGNOSIS — E785 Hyperlipidemia, unspecified: Secondary | ICD-10-CM

## 2011-04-15 DIAGNOSIS — I2510 Atherosclerotic heart disease of native coronary artery without angina pectoris: Secondary | ICD-10-CM

## 2011-04-15 DIAGNOSIS — I10 Essential (primary) hypertension: Secondary | ICD-10-CM

## 2011-04-15 MED ORDER — LISINOPRIL 20 MG PO TABS: 20.0000 mg | ORAL_TABLET | Freq: Every day | ORAL | Status: DC

## 2011-04-15 NOTE — Progress Notes (Signed)
  Subjective:    Patient ID: Amy Perry, female    DOB: 26-Apr-1946, 65 y.o.   MRN: [id number]  HPI Medical followup. History of hyperlipidemia, CAD, hypertension. Blood pressures ranging consistently over 140 systolic at home at around 90 diastolic. No headaches. Does have some occasional malaise. No chest pains. No dyspnea. Denies dizziness. Currently lisinopril 10 mg daily, omega-3 supplement, aspirin, and Crestor 10 mg daily.  No cough, myalgia, or other side effects.  Mild weight gain since last visit. Just recently started walking with a friend.  Past Medical History  Diagnosis Date  . HYPERLIPIDEMIA 07/11/2009  . MIGRAINE HEADACHE 07/15/2010  . 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 HYPERTENSION 07/11/2009  . MI 07/15/2010  . CAD 07/11/2009  . CHEST PAIN 07/15/2010   Past Surgical History  Procedure Date  . Appendectomy 1954    reports that she has quit smoking. She does not have any smokeless tobacco history on file. She reports that she does not drink alcohol. Her drug history not on file. family history includes Heart disease in her father and Hyperlipidemia in her father and mother. Allergies  Allergen Reactions  . Penicillins     REACTION: generalized weakness  . Sulfamethoxazole W/Trimethoprim     REACTION: generalized weakness     Review of Systems  Constitutional: Negative for fever, chills and unexpected weight change.  Respiratory: Negative for cough, shortness of breath and wheezing.   Cardiovascular: Negative for chest pain, palpitations and leg swelling.  Gastrointestinal: Negative for abdominal pain.  Neurological: Negative for weakness and headaches.  Psychiatric/Behavioral: Negative for dysphoric mood.       Objective:   Physical Exam  Constitutional: She is oriented to person, place, and time. She appears well-developed and well-nourished.  Neck: Neck supple. No thyromegaly present.  Cardiovascular: Normal rate and regular rhythm.   Pulmonary/Chest: Effort normal and  breath sounds normal. No respiratory distress. She has no wheezes. She has no rales.  Musculoskeletal: She exhibits no edema.  Neurological: She is alert and oriented to person, place, and time.          Assessment & Plan:  #1 hypertension suboptimally controlled. Repeat blood pressure sitting 142/96 right and left arm. Titrate lisinopril 20 mg daily #2 hyperlipidemia . History of good control. Reassess at followup in 4 months #3 history of CAD. No recent chest pain. Continue yearly flu vaccine

## 2011-08-16 ENCOUNTER — Encounter: Payer: Self-pay | Admitting: Family Medicine

## 2011-08-16 ENCOUNTER — Ambulatory Visit (INDEPENDENT_AMBULATORY_CARE_PROVIDER_SITE_OTHER): Payer: Managed Care, Other (non HMO) | Admitting: Family Medicine

## 2011-08-16 DIAGNOSIS — E785 Hyperlipidemia, unspecified: Secondary | ICD-10-CM

## 2011-08-16 DIAGNOSIS — I10 Essential (primary) hypertension: Secondary | ICD-10-CM

## 2011-08-16 DIAGNOSIS — I2510 Atherosclerotic heart disease of native coronary artery without angina pectoris: Secondary | ICD-10-CM

## 2011-08-16 LAB — LIPID PANEL
HDL: 47.6 mg/dL (ref >39.00)
LDL Cholesterol: 101 mg/dL — ABNORMAL HIGH (ref 0–99)
Total CHOL/HDL Ratio: 4
VLDL: 23.6 mg/dL (ref 0.0–40.0)

## 2011-08-16 LAB — BASIC METABOLIC PANEL
CO2: 27 mEq/L (ref 19–32)
Chloride: 108 mEq/L (ref 96–112)
Sodium: 142 mEq/L (ref 135–145)

## 2011-08-16 LAB — HEPATIC FUNCTION PANEL
ALT: 22 U/L (ref 0–35)
Bilirubin, Direct: 0.1 mg/dL (ref 0.0–0.3)
Total Bilirubin: 0.5 mg/dL (ref 0.3–1.2)

## 2011-08-16 NOTE — Progress Notes (Signed)
  Subjective:    Patient ID: Amy Perry, female    DOB: Mar 10, 1946, 65 y.o.   MRN: [id number]  HPI  Followup hyperlipidemia and hypertension. Patient has history of CAD. No recent chest pains. Remains on aspirin. At last visit, elevated blood pressure. Titrated lisinopril to 20 mg daily. Still has some blood pressures around 140 systolic. No headaches or dizziness. Plans to start water yoga exercises soon.  She has occasional myalgias and wonders if this may be related to her Crestor. Has had previous myalgias with Lipitor. These are relatively mild. Needs repeat lipids at this time.  Past Medical History  Diagnosis Date  . HYPERLIPIDEMIA 07/11/2009  . MIGRAINE HEADACHE 07/15/2010  . 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 HYPERTENSION 07/11/2009  . MI 07/15/2010  . CAD 07/11/2009  . CHEST PAIN 07/15/2010   Past Surgical History  Procedure Date  . Appendectomy 1954    reports that she has quit smoking. She does not have any smokeless tobacco history on file. She reports that she does not drink alcohol. Her drug history not on file. family history includes Heart disease in her father and Hyperlipidemia in her father and mother. Allergies  Allergen Reactions  . Penicillins     REACTION: generalized weakness  . Sulfamethoxazole W/Trimethoprim     REACTION: generalized weakness      Review of Systems  Constitutional: Negative for appetite change, fatigue and unexpected weight change.  Eyes: Negative for visual disturbance.  Respiratory: Negative for cough, chest tightness, shortness of breath and wheezing.   Cardiovascular: Negative for chest pain, palpitations and leg swelling.  Gastrointestinal: Negative for abdominal pain.  Genitourinary: Negative for dysuria.  Skin: Negative for rash.  Neurological: Negative for dizziness, seizures, syncope, weakness, light-headedness and headaches.  Psychiatric/Behavioral: Negative for dysphoric mood.       Objective:   Physical Exam  Constitutional: She  is oriented to person, place, and time. She appears well-developed and well-nourished. No distress.  HENT:  Mouth/Throat: Oropharynx is clear and moist.  Neck: Neck supple.  Cardiovascular: Normal rate and regular rhythm.   Pulmonary/Chest: Effort normal and breath sounds normal. No respiratory distress. She has no wheezes. She has no rales.  Musculoskeletal: She exhibits no edema.  Lymphadenopathy:    She has no cervical adenopathy.  Neurological: She is alert and oriented to person, place, and time.          Assessment & Plan:  #1 hypertension improved. Continue lisinopril. Continue weight loss efforts #2 hyperlipidemia in patient with history of CAD. Recheck lipid and hepatic panel. Continue aspirin

## 2011-08-17 NOTE — Progress Notes (Signed)
Quick Note:  Pt informed ______ 

## 2012-02-14 ENCOUNTER — Ambulatory Visit: Payer: Managed Care, Other (non HMO) | Admitting: Family Medicine

## 2012-02-29 ENCOUNTER — Ambulatory Visit: Payer: Managed Care, Other (non HMO) | Admitting: Family Medicine

## 2012-03-07 ENCOUNTER — Ambulatory Visit (INDEPENDENT_AMBULATORY_CARE_PROVIDER_SITE_OTHER): Payer: Medicare Other | Admitting: Family Medicine

## 2012-03-07 ENCOUNTER — Encounter: Payer: Self-pay | Admitting: Family Medicine

## 2012-03-07 VITALS — BP 130/80 | Temp 97.8°F | Wt 178.0 lb

## 2012-03-07 DIAGNOSIS — I2510 Atherosclerotic heart disease of native coronary artery without angina pectoris: Secondary | ICD-10-CM

## 2012-03-07 DIAGNOSIS — E785 Hyperlipidemia, unspecified: Secondary | ICD-10-CM

## 2012-03-07 DIAGNOSIS — I10 Essential (primary) hypertension: Secondary | ICD-10-CM

## 2012-03-07 DIAGNOSIS — J309 Allergic rhinitis, unspecified: Secondary | ICD-10-CM

## 2012-03-07 NOTE — Progress Notes (Signed)
  Subjective:    Patient ID: Amy Perry, female    DOB: 09/30/1945, 66 y.o.   MRN: [id number]  HPI  Patient seen for the following  Few day history of dry cough. Postnasal drip. Some nasal congestion. Patient thinks allergic. Tried Zyrtec but had sedation. No fever. No facial pain.  History of CAD. We have recommended establishing with local cardiologist and she has not yet done so. No recent chest pains. She reportedly had angioplasty back in the 1990s. Takes Crestor for hyperlipidemia but ran out about 3 weeks ago. Requesting samples.  History of hypertension treated with low-dose lisinopril. She was on 20 mg and reduce dose to 10 mg and blood pressures have been stable. No claudication symptoms.  Patient not had complete physical in some time. Needs to set up.  Lipid Panel     Component Value Date/Time   CHOL 172 08/16/2011 0910   TRIG 118.0 08/16/2011 0910   HDL 47.60 08/16/2011 0910   CHOLHDL 4 08/16/2011 0910   VLDL 23.6 08/16/2011 0910   LDLCALC 101* 08/16/2011 0910      Past Medical History  Diagnosis Date  . HYPERLIPIDEMIA 07/11/2009  . MIGRAINE HEADACHE 07/15/2010  . 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 HYPERTENSION 07/11/2009  . MI 07/15/2010  . CAD 07/11/2009  . CHEST PAIN 07/15/2010   Past Surgical History  Procedure Date  . Appendectomy 1954    reports that she has quit smoking. She does not have any smokeless tobacco history on file. She reports that she does not drink alcohol. Her drug history not on file. family history includes Heart disease in her father and Hyperlipidemia in her father and mother. Allergies  Allergen Reactions  . Penicillins     REACTION: generalized weakness  . Sulfamethoxazole W-Trimethoprim     REACTION: generalized weakness      Review of Systems  Constitutional: Negative for fatigue.  Eyes: Negative for visual disturbance.  Respiratory: Negative for cough, chest tightness, shortness of breath and wheezing.   Cardiovascular: Negative for  chest pain, palpitations and leg swelling.  Gastrointestinal: Negative for abdominal pain.  Neurological: Negative for dizziness, seizures, syncope, weakness, light-headedness and headaches.       Objective:   Physical Exam  Constitutional: She appears well-developed and well-nourished.  HENT:  Mouth/Throat: Oropharynx is clear and moist.  Neck: Neck supple. No thyromegaly present.       No carotid bruits.  Cardiovascular: Normal rate and regular rhythm.   Pulmonary/Chest: Effort normal and breath sounds normal. No respiratory distress. She has no wheezes. She has no rales.  Musculoskeletal: She exhibits no edema.       Good distal foot pulses.  Lymphadenopathy:    She has no cervical adenopathy.          Assessment & Plan:  #1 hypertension. Stable. Continue low-dose lisinopril #2 hyperlipidemia. Samples of Crestor provided. Recheck lipids at followup  #3 allergic rhinitis. Try over-the-counter Allegra 180 mg once daily  #4 health maintenance. Patient needs complete physical. Schedule and address health maintenance issues then including Pneumovax and Zostavax

## 2012-03-07 NOTE — Patient Instructions (Signed)
Consider over the counter Allegra for allergy symptoms. 

## 2013-07-31 ENCOUNTER — Other Ambulatory Visit (INDEPENDENT_AMBULATORY_CARE_PROVIDER_SITE_OTHER): Payer: Medicare Other

## 2013-07-31 DIAGNOSIS — Z0000 Encounter for general adult medical examination without abnormal findings: Secondary | ICD-10-CM

## 2013-07-31 DIAGNOSIS — E785 Hyperlipidemia, unspecified: Secondary | ICD-10-CM

## 2013-07-31 LAB — POCT URINALYSIS DIPSTICK
Blood, UA: NEGATIVE
Nitrite, UA: NEGATIVE
Urobilinogen, UA: 0.2
pH, UA: 6.5

## 2013-07-31 LAB — CBC WITH DIFFERENTIAL/PLATELET
Basophils Absolute: 0 10*3/uL (ref 0.0–0.1)
Eosinophils Relative: 1.5 % (ref 0.0–5.0)
HCT: 41.6 % (ref 36.0–46.0)
Hemoglobin: 14 g/dL (ref 12.0–15.0)
Lymphs Abs: 2.7 10*3/uL (ref 0.7–4.0)
MCV: 90.3 fl (ref 78.0–100.0)
Monocytes Absolute: 0.5 10*3/uL (ref 0.1–1.0)
Monocytes Relative: 7.7 % (ref 3.0–12.0)
Neutro Abs: 3.4 10*3/uL (ref 1.4–7.7)
Platelets: 276 10*3/uL (ref 150.0–400.0)
RDW: 13.3 % (ref 11.5–14.6)

## 2013-07-31 LAB — HEPATIC FUNCTION PANEL
ALT: 28 U/L (ref 0–35)
AST: 27 U/L (ref 0–37)
Albumin: 4 g/dL (ref 3.5–5.2)

## 2013-07-31 LAB — TSH: TSH: 1.15 u[IU]/mL (ref 0.35–5.50)

## 2013-07-31 LAB — BASIC METABOLIC PANEL
BUN: 16 mg/dL (ref 6–23)
Chloride: 107 mEq/L (ref 96–112)
GFR: 130.9 mL/min (ref >60.00)
Glucose, Bld: 89 mg/dL (ref 70–99)
Potassium: 3.7 mEq/L (ref 3.5–5.1)
Sodium: 138 mEq/L (ref 135–145)

## 2013-07-31 LAB — LIPID PANEL: Total CHOL/HDL Ratio: 3

## 2013-08-07 ENCOUNTER — Ambulatory Visit (INDEPENDENT_AMBULATORY_CARE_PROVIDER_SITE_OTHER): Payer: Medicare Other | Admitting: Family Medicine

## 2013-08-07 ENCOUNTER — Encounter: Payer: Self-pay | Admitting: Family Medicine

## 2013-08-07 ENCOUNTER — Other Ambulatory Visit (HOSPITAL_COMMUNITY)
Admission: RE | Admit: 2013-08-07 | Discharge: 2013-08-07 | Disposition: A | Discharge status: Home / Self Care | Payer: Medicare Other | Source: Ambulatory Visit | Attending: Family Medicine | Admitting: Family Medicine

## 2013-08-07 VITALS — BP 130/88 | HR 74 | Temp 98.0°F | Ht 62.0 in | Wt 184.0 lb

## 2013-08-07 DIAGNOSIS — Z23 Encounter for immunization: Secondary | ICD-10-CM

## 2013-08-07 DIAGNOSIS — Z0000 Encounter for general adult medical examination without abnormal findings: Secondary | ICD-10-CM

## 2013-08-07 DIAGNOSIS — Z124 Encounter for screening for malignant neoplasm of cervix: Secondary | ICD-10-CM | POA: Insufficient documentation

## 2013-08-07 DIAGNOSIS — E669 Obesity, unspecified: Secondary | ICD-10-CM | POA: Insufficient documentation

## 2013-08-07 MED ORDER — LISINOPRIL 20 MG PO TABS: 20.0000 mg | ORAL_TABLET | Freq: Every day | ORAL | Status: DC

## 2013-08-07 NOTE — Progress Notes (Signed)
Pre visit review using our clinic review tool, if applicable. No additional management support is needed unless otherwise documented below in the visit note. 

## 2013-08-07 NOTE — Patient Instructions (Signed)
Schedule mammogram Let us know if you decide to get shingles vaccine.

## 2013-08-07 NOTE — Progress Notes (Signed)
Subjective:    Patient ID: Amy Perry, female    DOB: 25-Oct-1945, 67 y.o.   MRN: [id number]  HPI Patient seen for complete physical examination and medical followup She has history of CAD, hypertension, hyperlipidemia. She takes lisinopril and Crestor and aspirin. No recent chest pains. Compliant with medications and denies any side effects. She has not had shingles vaccine. She needs followup mammogram. Colonoscopy is up-to-date. Already had flu vaccine. She needs Prevnar 13. Last Pap smear 3 years ago. No prior history of any abnormalities. Patient nonsmoker.  She's been exercising with some water aerobics with no recent chest pains or other complaints.  Past Medical History  Diagnosis Date  . HYPERLIPIDEMIA 07/11/2009  . MIGRAINE HEADACHE 07/15/2010  . 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 HYPERTENSION 07/11/2009  . MI 07/15/2010  . CAD 07/11/2009  . CHEST PAIN 07/15/2010   Past Surgical History  Procedure Laterality Date  . Appendectomy  1954    reports that she has quit smoking. She does not have any smokeless tobacco history on file. She reports that she does not drink alcohol. Her drug history is not on file. family history includes Heart disease (age of onset: 65) in her father; Hyperlipidemia in her father and mother. Allergies  Allergen Reactions  . Penicillins     REACTION: generalized weakness  . Sulfamethoxazole-Trimethoprim     REACTION: generalized weakness      Review of Systems  Constitutional: Negative for fever, activity change, appetite change, fatigue and unexpected weight change.  HENT: Negative for ear pain, hearing loss, sore throat and trouble swallowing.   Eyes: Negative for visual disturbance.  Respiratory: Negative for cough and shortness of breath.   Cardiovascular: Negative for chest pain and palpitations.  Gastrointestinal: Negative for abdominal pain, diarrhea, constipation and blood in stool.  Endocrine: Negative for polydipsia and polyuria.  Genitourinary:  Negative for dysuria and hematuria.  Musculoskeletal: Negative for arthralgias, back pain and myalgias.  Skin: Negative for rash.  Neurological: Negative for dizziness, syncope and headaches.  Hematological: Negative for adenopathy.  Psychiatric/Behavioral: Negative for confusion and dysphoric mood.       Objective:   Physical Exam  Constitutional: She is oriented to person, place, and time. She appears well-developed and well-nourished.  HENT:  Head: Normocephalic and atraumatic.  Eyes: EOM are normal. Pupils are equal, round, and reactive to light.  Neck: Normal range of motion. Neck supple. No thyromegaly present.  Cardiovascular: Normal rate, regular rhythm and normal heart sounds.   No murmur heard. Pulmonary/Chest: Breath sounds normal. No respiratory distress. She has no wheezes. She has no rales.  Abdominal: Soft. Bowel sounds are normal. She exhibits no distension and no mass. There is no tenderness. There is no rebound and no guarding.  Genitourinary:  Breasts are symmetric with no masses. Pelvic exam normal external genitalia. Vaginal mucosa normal. Cervix normal in appearance. Pap smear obtained. Bimanual no uterus masses or tenderness. No adnexal masses.  Musculoskeletal: Normal range of motion. She exhibits no edema.  Lymphadenopathy:    She has no cervical adenopathy.  Neurological: She is alert and oriented to person, place, and time. She displays normal reflexes. No cranial nerve deficit.  Skin: No rash noted.  Psychiatric: She has a normal mood and affect. Her behavior is normal. Judgment and thought content normal.          Assessment & Plan:  #1 complete physical. Prevnar 13 given. Check on coverage for shingles vaccine. Set up mammogram. Pap smear obtained. Colonoscopy up  to date. Flu vaccine already given. #2 hypertension. Stable. Refill lisinopril for one year #3 hyperlipidemia. Well controlled. Refill Crestor for one year

## 2013-08-22 ENCOUNTER — Ambulatory Visit: Payer: No Typology Code available for payment source | Admitting: Physical Therapy

## 2014-06-17 ENCOUNTER — Other Ambulatory Visit: Payer: Self-pay | Admitting: Family Medicine

## 2014-06-17 DIAGNOSIS — Z1231 Encounter for screening mammogram for malignant neoplasm of breast: Secondary | ICD-10-CM

## 2014-06-24 ENCOUNTER — Ambulatory Visit (HOSPITAL_COMMUNITY)
Admission: RE | Admit: 2014-06-24 | Discharge: 2014-06-24 | Disposition: A | Discharge status: Home / Self Care | Payer: Medicare HMO | Source: Ambulatory Visit | Attending: Family Medicine | Admitting: Family Medicine

## 2014-06-24 DIAGNOSIS — Z1231 Encounter for screening mammogram for malignant neoplasm of breast: Secondary | ICD-10-CM | POA: Diagnosis present

## 2014-10-10 ENCOUNTER — Other Ambulatory Visit: Payer: Self-pay | Admitting: Family Medicine

## 2014-10-18 ENCOUNTER — Other Ambulatory Visit: Payer: Self-pay | Admitting: Family Medicine

## 2014-10-21 ENCOUNTER — Other Ambulatory Visit: Payer: Self-pay

## 2014-10-23 ENCOUNTER — Other Ambulatory Visit: Payer: Self-pay | Admitting: Family Medicine

## 2014-11-19 ENCOUNTER — Telehealth: Payer: Self-pay | Admitting: Family Medicine

## 2014-11-19 NOTE — Telephone Encounter (Signed)
Noted  

## 2014-11-19 NOTE — Telephone Encounter (Signed)
Patient Name: Amy Perry DOB: 08/08/1946 Initial Comment Caller states she is needing her BP med refilled - has been off of them for a month. Caller states the office refused to refill them until she is seen in office - appt 11/21/14 @ 2:30. Caller states her BP is 170/80 and wants to know if some loaner pills can be called in until she is seen in office Nurse Assessment Nurse: Yetta BarreJones, RN, Miranda Date/Time (Eastern Time): 11/19/2014 1:18:57 PM Confirm and document reason for call. If symptomatic, describe symptoms. ---Caller states her BP is 173/83 and has had a headache. She has been without her BP meds for 1 month. Has the patient traveled out of the country within the last 30 days? ---Not Applicable Does the patient require triage? ---Yes Related visit to physician within the last 2 weeks? ---No Does the PT have any chronic conditions? (i.e. diabetes, asthma, etc.) ---Yes List chronic conditions. ---"heart condition" Hx of MI Guidelines Guideline Title Affirmed Question Affirmed Notes High Blood Pressure Ran out of BP medications Final Disposition User Call PCP within 24 Hours Jones, RN, Miranda Comments missed call Appt rescheduled for 3/16 at 1145 with Dr. Caryl NeverBurchette. Appt for 3/17 cancelled.

## 2014-11-20 ENCOUNTER — Encounter: Payer: Self-pay | Admitting: Family Medicine

## 2014-11-20 ENCOUNTER — Ambulatory Visit (INDEPENDENT_AMBULATORY_CARE_PROVIDER_SITE_OTHER): Payer: Commercial Managed Care - HMO | Admitting: Family Medicine

## 2014-11-20 VITALS — BP 150/90 | HR 78 | Temp 97.6°F | Wt 183.0 lb

## 2014-11-20 DIAGNOSIS — I2510 Atherosclerotic heart disease of native coronary artery without angina pectoris: Secondary | ICD-10-CM

## 2014-11-20 DIAGNOSIS — E785 Hyperlipidemia, unspecified: Secondary | ICD-10-CM

## 2014-11-20 DIAGNOSIS — I2583 Coronary atherosclerosis due to lipid rich plaque: Secondary | ICD-10-CM

## 2014-11-20 DIAGNOSIS — I10 Essential (primary) hypertension: Secondary | ICD-10-CM

## 2014-11-20 MED ORDER — PRAVASTATIN SODIUM 20 MG PO TABS: 20.0000 mg | ORAL_TABLET | Freq: Every day | ORAL | Status: DC

## 2014-11-20 MED ORDER — LISINOPRIL 20 MG PO TABS: 20.0000 mg | ORAL_TABLET | Freq: Every day | ORAL | Status: DC

## 2014-11-20 NOTE — Progress Notes (Signed)
   Subjective:    Patient ID: Amy Perry, female    DOB: 08/16/1946, 69 y.o.   MRN: [id number]  HPI Patient for medical follow-up. She has hypertension and unfortunately ran out of her medications about one month ago and has not had these filled since then. She has felt somewhat flushed and some fatigue since stopping her medication. No dizziness. No chest pains.  History of CAD. Patient relates history of MI around [id number] and reportedly had one vessel angioplasty at that time. She does not have details and is not sure which vessel. She has not established with cardiologist since moving here.  She has previously taken Lipitor but had myalgias. We had transitioned her to Crestor which she was taking regularly for some time but after running out of samples she did not take this further. She did not recall any myalgias with Crestor. She is concerned about cost with non-generics. Remains on aspirin 325 mg most days of the week Nonsmoker. No history of diabetes.  Past Medical History  Diagnosis Date  . HYPERLIPIDEMIA 07/11/2009  . MIGRAINE HEADACHE 07/15/2010  . 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 HYPERTENSION 07/11/2009  . MI 07/15/2010  . CAD 07/11/2009  . CHEST PAIN 07/15/2010   Past Surgical History  Procedure Laterality Date  . Appendectomy  1954    reports that she has quit smoking. She does not have any smokeless tobacco history on file. She reports that she does not drink alcohol. Her drug history is not on file. family history includes Heart disease (age of onset: 2373) in her father; Hyperlipidemia in her father and mother. Allergies  Allergen Reactions  . Penicillins     REACTION: generalized weakness  . Sulfamethoxazole-Trimethoprim     REACTION: generalized weakness     Review of Systems  Constitutional: Negative for fatigue.  Eyes: Negative for visual disturbance.  Respiratory: Negative for cough, chest tightness, shortness of breath and wheezing.   Cardiovascular: Negative for chest  pain, palpitations and leg swelling.  Endocrine: Negative for polydipsia and polyuria.  Neurological: Negative for dizziness, seizures, syncope, weakness, light-headedness and headaches.       Objective:   Physical Exam  Constitutional: She is oriented to person, place, and time. She appears well-developed and well-nourished.  Neck: Neck supple. No thyromegaly present.  Cardiovascular: Normal rate and regular rhythm.   Pulmonary/Chest: Effort normal and breath sounds normal. No respiratory distress. She has no wheezes. She has no rales.  Musculoskeletal: She exhibits no edema.  Neurological: She is alert and oriented to person, place, and time.          Assessment & Plan:  #1 hypertension. Poorly controlled but patient off medication for the past 3 weeks. Refill lisinopril 20 mg once daily #2 history of CAD. Remote history of reported MI several years ago and she states she had angioplasty at that time of one vessel. We do not have details at this time. She will send for records. Refer to establish with local cardiologist #3 dyslipidemia. Previous reported intolerance with Lipitor. She tolerated Crestor but had cost issues. Start pravastatin 20 mgs once daily. Recheck lipids at physical in 6-8 weeks

## 2014-11-20 NOTE — Progress Notes (Signed)
Pre visit review using our clinic review tool, if applicable. No additional management support is needed unless otherwise documented below in the visit note. 

## 2014-11-20 NOTE — Patient Instructions (Signed)
We will call you regarding cardiology appt. Schedule complete physical at some point later this year. Get back on daily Lisinopril

## 2014-11-21 ENCOUNTER — Telehealth: Payer: Self-pay | Admitting: Family Medicine

## 2014-11-21 ENCOUNTER — Ambulatory Visit: Payer: Medicare HMO | Admitting: Family Medicine

## 2014-11-21 NOTE — Telephone Encounter (Signed)
emmi mailed  °

## 2014-12-30 ENCOUNTER — Other Ambulatory Visit (INDEPENDENT_AMBULATORY_CARE_PROVIDER_SITE_OTHER): Payer: Commercial Managed Care - HMO

## 2014-12-30 DIAGNOSIS — Z0000 Encounter for general adult medical examination without abnormal findings: Secondary | ICD-10-CM

## 2014-12-30 LAB — CBC WITH DIFFERENTIAL/PLATELET
Basophils Absolute: 0 10*3/uL (ref 0.0–0.1)
Basophils Relative: 0.3 % (ref 0.0–3.0)
EOS PCT: 1.9 % (ref 0.0–5.0)
Eosinophils Absolute: 0.1 10*3/uL (ref 0.0–0.7)
HEMATOCRIT: 42.4 % (ref 36.0–46.0)
Hemoglobin: 14.4 g/dL (ref 12.0–15.0)
Lymphocytes Relative: 31.2 % (ref 12.0–46.0)
Lymphs Abs: 1.8 10*3/uL (ref 0.7–4.0)
MCHC: 34 g/dL (ref 30.0–36.0)
MCV: 89.7 fl (ref 78.0–100.0)
MONO ABS: 0.6 10*3/uL (ref 0.1–1.0)
Monocytes Relative: 10 % (ref 3.0–12.0)
Neutro Abs: 3.3 10*3/uL (ref 1.4–7.7)
Neutrophils Relative %: 56.6 % (ref 43.0–77.0)
PLATELETS: 282 10*3/uL (ref 150.0–400.0)
RBC: 4.72 Mil/uL (ref 3.87–5.11)
RDW: 13.8 % (ref 11.5–15.5)
WBC: 5.9 10*3/uL (ref 4.0–10.5)

## 2014-12-30 LAB — BASIC METABOLIC PANEL
BUN: 10 mg/dL (ref 6–23)
CO2: 25 meq/L (ref 19–32)
Calcium: 9.1 mg/dL (ref 8.4–10.5)
Chloride: 107 mEq/L (ref 96–112)
Creatinine, Ser: 0.57 mg/dL (ref 0.40–1.20)
GFR: 135.63 mL/min (ref >60.00)
Glucose, Bld: 99 mg/dL (ref 70–99)
Potassium: 4 mEq/L (ref 3.5–5.1)
SODIUM: 138 meq/L (ref 135–145)

## 2014-12-30 LAB — HEPATIC FUNCTION PANEL
ALBUMIN: 4 g/dL (ref 3.5–5.2)
ALK PHOS: 73 U/L (ref 39–117)
ALT: 27 U/L (ref 0–35)
AST: 29 U/L (ref 0–37)
BILIRUBIN TOTAL: 0.4 mg/dL (ref 0.2–1.2)
Bilirubin, Direct: 0.1 mg/dL (ref 0.0–0.3)
TOTAL PROTEIN: 6.8 g/dL (ref 6.0–8.3)

## 2014-12-30 LAB — LIPID PANEL
CHOL/HDL RATIO: 5
CHOLESTEROL: 164 mg/dL (ref 0–200)
HDL: 34 mg/dL — ABNORMAL LOW (ref >39.00)
LDL Cholesterol: 108 mg/dL — ABNORMAL HIGH (ref 0–99)
NONHDL: 130
Triglycerides: 112 mg/dL (ref 0.0–149.0)
VLDL: 22.4 mg/dL (ref 0.0–40.0)

## 2014-12-30 LAB — TSH: TSH: 1.26 u[IU]/mL (ref 0.35–4.50)

## 2015-01-06 ENCOUNTER — Ambulatory Visit (INDEPENDENT_AMBULATORY_CARE_PROVIDER_SITE_OTHER): Payer: Commercial Managed Care - HMO | Admitting: Family Medicine

## 2015-01-06 ENCOUNTER — Encounter: Payer: Self-pay | Admitting: Family Medicine

## 2015-01-06 VITALS — BP 130/90 | HR 81 | Temp 97.5°F | Ht 62.0 in | Wt 180.0 lb

## 2015-01-06 DIAGNOSIS — Z0000 Encounter for general adult medical examination without abnormal findings: Secondary | ICD-10-CM | POA: Diagnosis not present

## 2015-01-06 DIAGNOSIS — Z780 Asymptomatic menopausal state: Secondary | ICD-10-CM | POA: Diagnosis not present

## 2015-01-06 NOTE — Progress Notes (Signed)
Pre visit review using our clinic review tool, if applicable. No additional management support is needed unless otherwise documented below in the visit note. 

## 2015-01-06 NOTE — Patient Instructions (Signed)
Health Recommendations for Postmenopausal Women Respected and ongoing research has looked at the most common causes of death, disability, and poor quality of life in postmenopausal women. The causes include heart disease, diseases of blood vessels, diabetes, depression, cancer, and bone loss (osteoporosis). Many things can be done to help lower the chances of developing these and other common problems. CARDIOVASCULAR DISEASE Heart Disease: A heart attack is a medical emergency. Know the signs and symptoms of a heart attack. Below are things women can do to reduce their risk for heart disease.   Do not smoke. If you smoke, quit.  Aim for a healthy weight. Being overweight causes many preventable deaths. Eat a healthy and balanced diet and drink an adequate amount of liquids.  Get moving. Make a commitment to be more physically active. Aim for 30 minutes of activity on most, if not all days of the week.  Eat for heart health. Choose a diet that is low in saturated fat and cholesterol and eliminate trans fat. Include whole grains, vegetables, and fruits. Read and understand the labels on food containers before buying.  Know your numbers. Ask your caregiver to check your blood pressure, cholesterol (total, HDL, LDL, triglycerides) and blood glucose. Work with your caregiver on improving your entire clinical picture.  High blood pressure. Limit or stop your table salt intake (try salt substitute and food seasonings). Avoid salty foods and drinks. Read labels on food containers before buying. Eating well and exercising can help control high blood pressure. STROKE  Stroke is a medical emergency. Stroke may be the result of a blood clot in a blood vessel in the brain or by a brain hemorrhage (bleeding). Know the signs and symptoms of a stroke. To lower the risk of developing a stroke:  Avoid fatty foods.  Quit smoking.  Control your diabetes, blood pressure, and irregular heart rate. THROMBOPHLEBITIS  (BLOOD CLOT) OF THE LEG  Becoming overweight and leading a stationary lifestyle may also contribute to developing blood clots. Controlling your diet and exercising will help lower the risk of developing blood clots. CANCER SCREENING  Breast Cancer: Take steps to reduce your risk of breast cancer.  You should practice "breast self-awareness." This means understanding the normal appearance and feel of your breasts and should include breast self-examination. Any changes detected, no matter how small, should be reported to your caregiver.  After age 40, you should have a clinical breast exam (CBE) every year.  Starting at age 40, you should consider having a mammogram (breast X-ray) every year.  If you have a family history of breast cancer, talk to your caregiver about genetic screening.  If you are at high risk for breast cancer, talk to your caregiver about having an MRI and a mammogram every year.  Intestinal or Stomach Cancer: Tests to consider are a rectal exam, fecal occult blood, sigmoidoscopy, and colonoscopy. Women who are high risk may need to be screened at an earlier age and more often.  Cervical Cancer:  Beginning at age 30, you should have a Pap test every 3 years as long as the past 3 Pap tests have been normal.  If you have had past treatment for cervical cancer or a condition that could lead to cancer, you need Pap tests and screening for cancer for at least 20 years after your treatment.  If you had a hysterectomy for a problem that was not cancer or a condition that could lead to cancer, then you no longer need Pap tests.    If you are between ages 65 and 70, and you have had normal Pap tests going back 10 years, you no longer need Pap tests.  If Pap tests have been discontinued, risk factors (such as a new sexual partner) need to be reassessed to determine if screening should be resumed.  Some medical problems can increase the chance of getting cervical cancer. In these  cases, your caregiver may recommend more frequent screening and Pap tests.  Uterine Cancer: If you have vaginal bleeding after reaching menopause, you should notify your caregiver.  Ovarian Cancer: Other than yearly pelvic exams, there are no reliable tests available to screen for ovarian cancer at this time except for yearly pelvic exams.  Lung Cancer: Yearly chest X-rays can detect lung cancer and should be done on high risk women, such as cigarette smokers and women with chronic lung disease (emphysema).  Skin Cancer: A complete body skin exam should be done at your yearly examination. Avoid overexposure to the sun and ultraviolet light lamps. Use a strong sun block cream when in the sun. All of these things are important for lowering the risk of skin cancer. MENOPAUSE Menopause Symptoms: Hormone therapy products are effective for treating symptoms associated with menopause:  Moderate to severe hot flashes.  Night sweats.  Mood swings.  Headaches.  Tiredness.  Loss of sex drive.  Insomnia.  Other symptoms. Hormone replacement carries certain risks, especially in older women. Women who use or are thinking about using estrogen or estrogen with progestin treatments should discuss that with their caregiver. Your caregiver will help you understand the benefits and risks. The ideal dose of hormone replacement therapy is not known. The Food and Drug Administration (FDA) has concluded that hormone therapy should be used only at the lowest doses and for the shortest amount of time to reach treatment goals.  OSTEOPOROSIS Protecting Against Bone Loss and Preventing Fracture If you use hormone therapy for prevention of bone loss (osteoporosis), the risks for bone loss must outweigh the risk of the therapy. Ask your caregiver about other medications known to be safe and effective for preventing bone loss and fractures. To guard against bone loss or fractures, the following is recommended:  If  you are younger than age 50, take 1000 mg of calcium and at least 600 mg of Vitamin D per day.  If you are older than age 50 but younger than age 70, take 1200 mg of calcium and at least 600 mg of Vitamin D per day.  If you are older than age 70, take 1200 mg of calcium and at least 800 mg of Vitamin D per day. Smoking and excessive alcohol intake increases the risk of osteoporosis. Eat foods rich in calcium and vitamin D and do weight bearing exercises several times a week as your caregiver suggests. DIABETES Diabetes Mellitus: If you have type I or type 2 diabetes, you should keep your blood sugar under control with diet, exercise, and recommended medication. Avoid starchy and fatty foods, and too many sweets. Being overweight can make diabetes control more difficult. COGNITION AND MEMORY Cognition and Memory: Menopausal hormone therapy is not recommended for the prevention of cognitive disorders such as Alzheimer's disease or memory loss.  DEPRESSION  Depression may occur at any age, but it is common in elderly women. This may be because of physical, medical, social (loneliness), or financial problems and needs. If you are experiencing depression because of medical problems and control of symptoms, talk to your caregiver about this. Physical   activity and exercise may help with mood and sleep. Community and volunteer involvement may improve your sense of value and worth. If you have depression and you feel that the problem is getting worse or becoming severe, talk to your caregiver about which treatment options are best for you. ACCIDENTS  Accidents are common and can be serious in elderly woman. Prepare your house to prevent accidents. Eliminate throw rugs, place hand bars in bath, shower, and toilet areas. Avoid wearing high heeled shoes or walking on wet, snowy, and icy areas. Limit or stop driving if you have vision or hearing problems, or if you feel you are unsteady with your movements and  reflexes. HEPATITIS C Hepatitis C is a type of viral infection affecting the liver. It is spread mainly through contact with blood from an infected person. It can be treated, but if left untreated, it can lead to severe liver damage over the years. Many people who are infected do not know that the virus is in their blood. If you are a "baby-boomer", it is recommended that you have one screening test for Hepatitis C. IMMUNIZATIONS  Several immunizations are important to consider having during your senior years, including:   Tetanus, diphtheria, and pertussis booster shot.  Influenza every year before the flu season begins.  Pneumonia vaccine.  Shingles vaccine.  Others, as indicated based on your specific needs. Talk to your caregiver about these. Document Released: 10/15/2005 Document Revised: 01/07/2014 Document Reviewed: 06/10/2008 Exit[employer] Patient Information 2015 [employer]. This information is not intended to replace advice given to you by your health care provider. Make sure you discuss any questions you have with your health care provider.  

## 2015-01-06 NOTE — Progress Notes (Signed)
   Subjective:    Patient ID: Amy Perry, female    DOB: 07/31/1946, 69 y.o.   MRN: [id number]  HPI Patient here for complete physical. She has history of hyperlipidemia, hypertension, CAD. She's had previous intolerance of statins with Crestor and Lipitor with myalgias. We tried pravastatin but she developed nausea, diarrhea, and palpitations and those symptoms when away completely after she discontinued. She is reluctant to consider any statins at this point. Last Pap smear little over year ago normal. Low risk. Mammogram up-to-date. Colonoscopy up-to-date.  She walks for exercise. No recent chest pains. Nonsmoker. Declines shingles vaccine for lack of coverage. Other immunizations are up-to-date.  Past Medical History  Diagnosis Date  . HYPERLIPIDEMIA 07/11/2009  . MIGRAINE HEADACHE 07/15/2010  . 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 HYPERTENSION 07/11/2009  . MI 07/15/2010  . CAD 07/11/2009  . CHEST PAIN 07/15/2010   Past Surgical History  Procedure Laterality Date  . Appendectomy  1954    reports that she has quit smoking. She does not have any smokeless tobacco history on file. She reports that she does not drink alcohol. Her drug history is not on file. family history includes Heart disease (age of onset: 7973) in her father; Hyperlipidemia in her father and mother. Allergies  Allergen Reactions  . Penicillins     REACTION: generalized weakness  . Sulfamethoxazole-Trimethoprim     REACTION: generalized weakness      Review of Systems  Constitutional: Negative for fever, activity change, appetite change, fatigue and unexpected weight change.  HENT: Negative for ear pain, hearing loss, sore throat and trouble swallowing.   Eyes: Negative for visual disturbance.  Respiratory: Negative for cough and shortness of breath.   Cardiovascular: Negative for chest pain and palpitations.  Gastrointestinal: Negative for abdominal pain, diarrhea, constipation and blood in stool.  Endocrine: Negative  for polydipsia and polyuria.  Genitourinary: Negative for dysuria and hematuria.  Musculoskeletal: Negative for myalgias, back pain and arthralgias.  Skin: Negative for rash.  Neurological: Negative for dizziness, syncope and headaches.  Hematological: Negative for adenopathy.  Psychiatric/Behavioral: Negative for confusion and dysphoric mood.       Objective:   Physical Exam  Constitutional: She is oriented to person, place, and time. She appears well-developed and well-nourished.  HENT:  Head: Normocephalic and atraumatic.  Eyes: EOM are normal. Pupils are equal, round, and reactive to light.  Neck: Normal range of motion. Neck supple. No thyromegaly present.  Cardiovascular: Normal rate, regular rhythm and normal heart sounds.   No murmur heard. Pulmonary/Chest: Breath sounds normal. No respiratory distress. She has no wheezes. She has no rales.  Abdominal: Soft. Bowel sounds are normal. She exhibits no distension and no mass. There is no tenderness. There is no rebound and no guarding.  Genitourinary:  Breasts are symmetric with no mass  Musculoskeletal: Normal range of motion. She exhibits no edema.  Lymphadenopathy:    She has no cervical adenopathy.  Neurological: She is alert and oriented to person, place, and time. She displays normal reflexes. No cranial nerve deficit.  Skin: No rash noted.  Psychiatric: She has a normal mood and affect. Her behavior is normal. Judgment and thought content normal.          Assessment & Plan:  Complete physical. Set up bone density scan. Continue yearly mammogram. Continue yearly flu vaccine. We discussed shingles vaccine and she is not interested. We decided to go to every other year Pap smear as she is low risk.

## 2015-01-10 ENCOUNTER — Encounter: Payer: Self-pay | Admitting: Cardiology

## 2015-01-10 ENCOUNTER — Ambulatory Visit (INDEPENDENT_AMBULATORY_CARE_PROVIDER_SITE_OTHER): Payer: Commercial Managed Care - HMO | Admitting: Cardiology

## 2015-01-10 VITALS — BP 158/102 | HR 63 | Ht 64.0 in | Wt 181.9 lb

## 2015-01-10 DIAGNOSIS — E785 Hyperlipidemia, unspecified: Secondary | ICD-10-CM | POA: Diagnosis not present

## 2015-01-10 DIAGNOSIS — I10 Essential (primary) hypertension: Secondary | ICD-10-CM | POA: Diagnosis not present

## 2015-01-10 DIAGNOSIS — I2510 Atherosclerotic heart disease of native coronary artery without angina pectoris: Secondary | ICD-10-CM | POA: Diagnosis not present

## 2015-01-10 NOTE — Patient Instructions (Signed)
Continue to work on [employer] Holdingsyour diet and exercise program  We will schedule you for a nuclear stress test  I will see you in one year if the stress test looks OK.

## 2015-01-10 NOTE — Progress Notes (Signed)
Cardiology Office Note   Date:  01/10/2015   ID:  Amy Perry, DOB 03/04/1946, MRN [id number]  PCP:  Kristian CoveyBURCHETTE,BRUCE W, MD  Cardiologist:   Jaelah Hauth [place], MD   Chief Complaint  Patient presents with  . Coronary Artery Disease      History of Present Illness: Amy Perry is a 69 y.o. female who presents for cardiac evaluation at the request of Dr. Caryl NeverBurchette. She is a pleasant BF with history of CAD. She reports a history of myocardial infarction in the late 1990s. [place]. She does not recall any details of her evaluation and treatment. She presented with Nausea/ vomiting and right arm pain. [place] but this was over 8 years ago. She does have a history of HTN. She is on lisinopril. Admits to taking medication sporadically. Also has a history of hyperlipidemia. Reports intolerance of Crestor and lipitor due to myalgias. Pravastatin caused a lot of GI side effects. She is trying to walk more and has lost 4 lbs this month. Denies any chest pain or dyspnea. No edema. Rare symptoms of heart beating fast.    Past Medical History  Diagnosis Date  . HYPERLIPIDEMIA 07/11/2009  . MIGRAINE HEADACHE 07/15/2010  . 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 HYPERTENSION 07/11/2009  . MI 07/15/2010  . CAD 07/11/2009  . CHEST PAIN 07/15/2010  . CAD (coronary artery disease), native coronary artery     Past Surgical History  Procedure Laterality Date  . Appendectomy  1954     Current Outpatient Prescriptions  Medication Sig Dispense Refill  . aspirin 325 MG tablet Take 325 mg by mouth daily.      Marland Kitchen. lisinopril (PRINIVIL,ZESTRIL) 20 MG tablet Take 1 tablet (20 mg total) by mouth daily. 90 tablet 3  . Multiple Vitamins-Minerals (MULTIPLE VITAMINS/WOMENS PO) Take by mouth daily.       No current facility-administered medications for this visit.    Allergies:   Penicillins and  Sulfamethoxazole-trimethoprim    Social History:  The patient  reports that she has quit smoking. She does not have any smokeless tobacco history on file. She reports that she does not drink alcohol.   Family History:  The patient's family history includes Heart disease (age of onset: 5273) in her father; Hyperlipidemia in her father.    ROS:  Please see the history of present illness.   Otherwise, review of systems are positive for none.   All other systems are reviewed and negative.    PHYSICAL EXAM: VS:  BP 158/102 mmHg  Pulse 63  Ht 5\' 4"  (1.626 m)  Wt 181 lb 14.4 oz (82.509 kg)  BMI 31.21 kg/m2 , BMI Body mass index is 31.21 kg/(m^2). GEN: Well nourished, well developed, in no acute distress HEENT: normal Neck: no JVD, carotid bruits, or masses Cardiac: RRR; no murmurs, rubs, or gallops,no edema  Respiratory:  clear to auscultation bilaterally, normal work of breathing GI: soft, nontender, nondistended, + BS MS: no deformity or atrophy Skin: warm and dry, no rash Neuro:  Strength and sensation are intact Psych: euthymic mood, full affect   EKG:  EKG is ordered today. The ekg ordered today demonstrates NSR with rate 63. Old anterolateral infarct. Low voltage. I have personally reviewed and interpreted this study.    Recent Labs: 12/30/2014: ALT 27; BUN 10; Creatinine 0.57; Hemoglobin 14.4; Platelets 282.0; Potassium 4.0; Sodium 138;  TSH 1.26    Lipid Panel    Component Value Date/Time   CHOL 164 12/30/2014 0957   TRIG 112.0 12/30/2014 0957   HDL 34.00* 12/30/2014 0957   CHOLHDL 5 12/30/2014 0957   VLDL 22.4 12/30/2014 0957   LDLCALC 108* 12/30/2014 0957   LDLDIRECT 142.0 06/23/2010 1102      Wt Readings from Last 3 Encounters:  01/10/15 181 lb 14.4 oz (82.509 kg)  01/06/15 180 lb (81.647 kg)  11/20/14 183 lb (83.008 kg)      Other studies Reviewed: Additional studies/ records that were reviewed today include: none.    ASSESSMENT AND PLAN:  1.  CAD  with remote myocardial infarction. Appears to be anterolateral by Ecg. No old records available. Apparently emergent PTCA done. Currently asymptomatic. Continue ASA. Recommend stress myoview. If EF is low will need Echo as well. Discussed appropriate lifestyle modification including dietary modification and regular aerobic exercise. 2. HTN. BP poor today but 130/90 at Dr. Lucie LeatherBurchette's office. She did not take meds today. Stressed that she needs to take lisinopril daily. If EF low may need to add low dose beta blocker.  3. Dyslipidemia. Reports intolerance of stating. Apparently tolerated Crestor pretty well for a period of time. I wonder if she could tolerate a low dose of Crestor- maybe 5 mg 3x/wk and then could add Zetia if still not at goal. Patient seems resistant to taking medication.      The following changes have been made:  no change  Labs/ tests ordered today include:  Orders Placed This Encounter  Procedures  . Myocardial Perfusion Imaging  . EKG 12-Lead     Disposition:   FU with Dr. [place]Jordan  in 1 year if stress test is OK. If abnormal will arrange closer follow up.  Signed, Ileah Falkenstein [place], MD, [facility] 01/10/2015 12:33 PM    [facility] [address], [place], [place], [id number] Phone [phone], Fax [phone]

## 2015-01-13 ENCOUNTER — Ambulatory Visit (INDEPENDENT_AMBULATORY_CARE_PROVIDER_SITE_OTHER)
Admission: RE | Admit: 2015-01-13 | Discharge: 2015-01-13 | Disposition: A | Discharge status: Home / Self Care | Payer: Commercial Managed Care - HMO | Source: Ambulatory Visit | Attending: Family Medicine | Admitting: Family Medicine

## 2015-01-13 DIAGNOSIS — Z780 Asymptomatic menopausal state: Secondary | ICD-10-CM

## 2015-01-31 ENCOUNTER — Telehealth (HOSPITAL_COMMUNITY): Payer: Self-pay

## 2015-01-31 NOTE — Telephone Encounter (Signed)
Encounter complete. 

## 2015-02-04 ENCOUNTER — Telehealth (HOSPITAL_COMMUNITY): Payer: Self-pay

## 2015-02-04 ENCOUNTER — Ambulatory Visit (HOSPITAL_COMMUNITY)
Admission: RE | Admit: 2015-02-04 | Discharge: 2015-02-04 | Disposition: A | Discharge status: Home / Self Care | Payer: Commercial Managed Care - HMO | Source: Ambulatory Visit | Attending: Cardiology | Admitting: Cardiology

## 2015-02-04 DIAGNOSIS — E785 Hyperlipidemia, unspecified: Secondary | ICD-10-CM | POA: Insufficient documentation

## 2015-02-04 DIAGNOSIS — I2510 Atherosclerotic heart disease of native coronary artery without angina pectoris: Secondary | ICD-10-CM | POA: Diagnosis not present

## 2015-02-04 DIAGNOSIS — I10 Essential (primary) hypertension: Secondary | ICD-10-CM | POA: Insufficient documentation

## 2015-02-04 LAB — MYOCARDIAL PERFUSION IMAGING
CHL CUP RESTING HR STRESS: 60 {beats}/min
CHL CUP STRESS STAGE 1 GRADE: 0 %
CHL CUP STRESS STAGE 1 SPEED: 0 mph
CHL CUP STRESS STAGE 3 GRADE: 0 %
CHL CUP STRESS STAGE 3 SPEED: 1 mph
CHL CUP STRESS STAGE 5 SPEED: 2.5 mph
CHL CUP STRESS STAGE 6 GRADE: 12.2 %
CHL CUP STRESS STAGE 6 SPEED: 2.5 mph
CHL CUP STRESS STAGE 7 GRADE: 0 %
CHL CUP STRESS STAGE 7 HR: 99 {beats}/min
CHL CUP STRESS STAGE 8 SPEED: 0 mph
Estimated workload: 7 METS
Exercise duration (min): 6 min
Exercise duration (sec): 0 s
LVDIAVOL: 91 mL
LVSYSVOL: 53 mL
MPHR: 152 {beats}/min
NUC STRESS EF: 42 %
Peak HR: 133 {beats}/min
Percent HR: 87 %
Percent of predicted max HR: 87 %
RPE: 20349
SDS: 5
SRS: 16
SSS: 21
Stage 1 HR: 78 {beats}/min
Stage 2 Grade: 0 %
Stage 2 HR: 75 {beats}/min
Stage 2 Speed: 1 mph
Stage 3 HR: 75 {beats}/min
Stage 4 Grade: 10 %
Stage 4 HR: 115 {beats}/min
Stage 4 Speed: 1.7 mph
Stage 5 Grade: 12 %
Stage 5 HR: 133 {beats}/min
Stage 6 HR: 133 {beats}/min
Stage 7 Speed: 0 mph
Stage 8 Grade: 0 %
Stage 8 HR: 79 {beats}/min
TID: 1.03

## 2015-02-04 MED ORDER — TECHNETIUM TC 99M SESTAMIBI GENERIC - CARDIOLITE
30.6000 | Freq: Once | INTRAVENOUS | Status: AC | PRN
  Administered 2015-02-04: 31 via INTRAVENOUS

## 2015-02-04 MED ORDER — TECHNETIUM TC 99M SESTAMIBI GENERIC - CARDIOLITE
10.9000 | Freq: Once | INTRAVENOUS | Status: AC | PRN
  Administered 2015-02-04: 10.9 via INTRAVENOUS

## 2015-02-04 NOTE — Telephone Encounter (Signed)
Encounter complete. 

## 2015-02-06 ENCOUNTER — Other Ambulatory Visit: Payer: Self-pay

## 2015-02-06 MED ORDER — CARVEDILOL 3.125 MG PO TABS: 3.1250 mg | ORAL_TABLET | Freq: Two times a day (BID) | ORAL | Status: DC

## 2015-05-13 ENCOUNTER — Ambulatory Visit: Payer: Commercial Managed Care - HMO | Admitting: Cardiology

## 2015-08-18 ENCOUNTER — Ambulatory Visit: Payer: Commercial Managed Care - HMO | Admitting: Cardiology

## 2015-10-31 ENCOUNTER — Ambulatory Visit: Payer: Commercial Managed Care - HMO | Admitting: Cardiology

## 2015-12-31 ENCOUNTER — Encounter: Payer: Self-pay | Admitting: Cardiology

## 2015-12-31 ENCOUNTER — Ambulatory Visit (INDEPENDENT_AMBULATORY_CARE_PROVIDER_SITE_OTHER): Payer: Commercial Managed Care - HMO | Admitting: Cardiology

## 2015-12-31 VITALS — BP 135/77 | HR 85 | Ht 64.0 in | Wt 174.4 lb

## 2015-12-31 DIAGNOSIS — I2510 Atherosclerotic heart disease of native coronary artery without angina pectoris: Secondary | ICD-10-CM | POA: Diagnosis not present

## 2015-12-31 DIAGNOSIS — E785 Hyperlipidemia, unspecified: Secondary | ICD-10-CM

## 2015-12-31 DIAGNOSIS — I10 Essential (primary) hypertension: Secondary | ICD-10-CM

## 2015-12-31 DIAGNOSIS — I255 Ischemic cardiomyopathy: Secondary | ICD-10-CM | POA: Insufficient documentation

## 2015-12-31 MED ORDER — METOPROLOL TARTRATE 25 MG PO TABS
25.0000 mg | ORAL_TABLET | Freq: Two times a day (BID) | ORAL | Status: DC
Start: 2015-12-31 — End: 2017-01-17

## 2015-12-31 MED ORDER — ROSUVASTATIN CALCIUM 10 MG PO TABS
10.0000 mg | ORAL_TABLET | Freq: Every day | ORAL | Status: DC
Start: 2015-12-31 — End: 2017-01-05

## 2015-12-31 NOTE — Patient Instructions (Signed)
Switch carvedilol to metoprolol 25 mg twice a day  Resume crestor 5 mg daily  I will see you in 6 months

## 2015-12-31 NOTE — Progress Notes (Signed)
Cardiology Office Note   Date:  12/31/2015   ID:  Amy Perry, DOB October 19, 1945, MRN [id number]  PCP:  Kristian Covey, MD  Cardiologist:   Chris Cripps [place], MD   Chief Complaint  Patient presents with  . Follow-up    SOB;none CHEST PAIN;none LIGHTHEADED/DIZZINESS;none PAIN OR CRAMPING IN LEGS;pain in both legs EDEMA;none      History of Present Illness: Amy Perry is a 70 y.o. female who presents for follow up CAD and ischemic cardiomyopathy. She reports a history of myocardial infarction in the late 1990s. She was treated in [place]. [place], [place] and had emergent angioplasty. She does not recall any details of her evaluation and treatment. She presented with Nausea/ vomiting and right arm pain. She was followed for some time by cardiology in [facility] including stress tests but this was over 8 years ago. She does have a history of HTN. She is on lisinopril. Admits to taking medication sporadically. Also has a history of hyperlipidemia. Reports intolerance of  lipitor and pravastatin due to myalgias. She apparently was able to tolerate Crestor 5 mg daily in the past but just quit taking it.  She had a Myoview study in 5/16 that showed an anterior wall infarct without ischemia and EF 42%. We recommended addition of Coreg 3.125 mg bid.  On follow up she complains of arthritis pain and thinks it is related to the Coreg. Admits she doesn't like taking medications and thinks she reacts to everything.    Past Medical History  Diagnosis Date  . HYPERLIPIDEMIA 07/11/2009  . MIGRAINE HEADACHE 07/15/2010  . 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 HYPERTENSION 07/11/2009  . MI 07/15/2010  . CAD 07/11/2009  . CHEST PAIN 07/15/2010  . CAD (coronary artery disease), native coronary artery     Past Surgical History  Procedure Laterality Date  . Appendectomy  1954     Current Outpatient Prescriptions  Medication Sig Dispense Refill  . aspirin 325 MG tablet Take 325 mg by mouth daily.      Marland Kitchen lisinopril  (PRINIVIL,ZESTRIL) 20 MG tablet Take 1 tablet (20 mg total) by mouth daily. 90 tablet 3  . Multiple Vitamins-Minerals (MULTIPLE VITAMINS/WOMENS PO) Take by mouth daily.      . metoprolol tartrate (LOPRESSOR) 25 MG tablet Take 1 tablet (25 mg total) by mouth 2 (two) times daily. 180 tablet 3  . rosuvastatin (CRESTOR) 10 MG tablet Take 1 tablet (10 mg total) by mouth daily. 30 tablet 11   No current facility-administered medications for this visit.    Allergies:   Penicillins and Sulfamethoxazole-trimethoprim    Social History:  The patient  reports that she has quit smoking. She does not have any smokeless tobacco history on file. She reports that she does not drink alcohol.   Family History:  The patient's family history includes Heart disease (age of onset: 17) in her father; Hyperlipidemia in her father.    ROS:  Please see the history of present illness.   Otherwise, review of systems are positive for none.   All other systems are reviewed and negative.    PHYSICAL EXAM: VS:  BP 135/77 mmHg  Pulse 85  Ht  (1.626 m)  Wt 79.107 kg (174 lb 6.4 oz)  BMI 29.92 kg/m2 , BMI Body mass index is 29.92 kg/(m^2). GEN: Well nourished, well developed, in no acute distress HEENT: normal Neck: no JVD, carotid bruits, or masses Cardiac: RRR; no murmurs, rubs, or gallops,no edema  Respiratory:  clear to  auscultation bilaterally, normal work of breathing GI: soft, nontender, nondistended, + BS MS: no deformity or atrophy Skin: warm and dry, no rash Neuro:  Strength and sensation are intact Psych: euthymic mood, full affect   EKG:  EKG is  ordered today. The ekg ordered today demonstrates NSR with rate 75. Old anterior MI. Low voltage. I have personally reviewed and interpreted this study.    Recent Labs: No results found for requested labs within last 365 days.    Lipid Panel    Component Value Date/Time   CHOL 164 12/30/2014 0957   TRIG 112.0 12/30/2014 0957   HDL 34.00*  12/30/2014 0957   CHOLHDL 5 12/30/2014 0957   VLDL 22.4 12/30/2014 0957   LDLCALC 108* 12/30/2014 0957   LDLDIRECT 142.0 06/23/2010 1102      Wt Readings from Last 3 Encounters:  12/31/15 79.107 kg (174 lb 6.4 oz)  02/04/15 82.101 kg (181 lb)  01/10/15 82.509 kg (181 lb 14.4 oz)      Other studies Reviewed: Additional studies/ records that were reviewed today include: Myoview  Study Highlights    Intermediate risk stress nuclear study with large, severe, fixed apical defect consistent with prior infarct; no ischemia; EF 42 with apical akinesis; study intermediate risk due to reduced LV function     ASSESSMENT AND PLAN:  1.  CAD with remote anterior myocardial infarction. Appears to be anterolateral by Ecg and by Myoview.  Apparently emergent PTCA done. Currently asymptomatic. Continue ASA. Discussed appropriate lifestyle modification including dietary modification and regular aerobic exercise.  2. HTN. BP with improved control today with addition of Coreg.  3. Dyslipidemia. Reports intolerance of statins. Apparently tolerated Crestor pretty well for a period of time. Will resume Crestor 5 mg daily  4. Ischemic cardiomyopathy. EF 42%. Discussed rationale for medical therapy including ACEi and beta blocker to reduce risk of further LV deterioration or CHF. I think it is very unlikely that Coreg is causing her arthralgias. Recommend switching this to metoprolol 25 mg bid.      The following changes have been made:  See above  Labs/ tests ordered today include:   Orders Placed This Encounter  Procedures  . EKG 12-Lead     Disposition:   FU with Dr. SwazilandJordan  in 6 months.  Signed, Zoey Gilkeson Swazil[place], MD, [facility] 12/31/2015 5:59 PM    [facility] [address], [place], [place], [id number] Phone [phone], Fax [phone]

## 2016-01-15 ENCOUNTER — Other Ambulatory Visit: Payer: Self-pay | Admitting: Family Medicine

## 2016-07-21 ENCOUNTER — Other Ambulatory Visit: Payer: Self-pay | Admitting: Family Medicine

## 2016-07-23 ENCOUNTER — Other Ambulatory Visit: Payer: Self-pay

## 2016-07-23 MED ORDER — LISINOPRIL 20 MG PO TABS: 20.0000 mg | ORAL_TABLET | Freq: Every day | ORAL | 1 refills | Status: AC

## 2016-07-30 ENCOUNTER — Other Ambulatory Visit: Payer: Self-pay | Admitting: Family Medicine

## 2017-01-05 ENCOUNTER — Other Ambulatory Visit: Payer: Self-pay | Admitting: Cardiology

## 2017-01-05 NOTE — Telephone Encounter (Signed)
REFILL 

## 2017-01-17 ENCOUNTER — Other Ambulatory Visit: Payer: Self-pay | Admitting: *Deleted

## 2017-01-17 MED ORDER — METOPROLOL TARTRATE 25 MG PO TABS: 25.0000 mg | ORAL_TABLET | Freq: Two times a day (BID) | ORAL | 0 refills | Status: DC

## 2017-01-17 NOTE — Telephone Encounter (Signed)
Rx has been sent to the pharmacy electronically. ° °

## 2017-03-01 ENCOUNTER — Other Ambulatory Visit: Payer: Self-pay | Admitting: Cardiology

## 2020-11-11 ENCOUNTER — Encounter: Payer: Self-pay | Admitting: Gastroenterology
